# Patient Record
Sex: Female | Born: 1950 | Race: White | Hispanic: No | Marital: Single | Smoking: Never smoker
Health system: Southern US, Community
[De-identification: ages and names within clinical notes are randomized; demographics above are authoritative.]

## PROBLEM LIST (undated history)

## (undated) DIAGNOSIS — D369 Benign neoplasm, unspecified site: Secondary | ICD-10-CM

## (undated) DIAGNOSIS — G4700 Insomnia, unspecified: Secondary | ICD-10-CM

## (undated) DIAGNOSIS — M8580 Other specified disorders of bone density and structure, unspecified site: Secondary | ICD-10-CM

## (undated) DIAGNOSIS — M722 Plantar fascial fibromatosis: Secondary | ICD-10-CM

## (undated) DIAGNOSIS — K5792 Diverticulitis of intestine, part unspecified, without perforation or abscess without bleeding: Secondary | ICD-10-CM

## (undated) DIAGNOSIS — M1990 Unspecified osteoarthritis, unspecified site: Secondary | ICD-10-CM

## (undated) DIAGNOSIS — F32A Depression, unspecified: Secondary | ICD-10-CM

## (undated) DIAGNOSIS — M5030 Other cervical disc degeneration, unspecified cervical region: Secondary | ICD-10-CM

## (undated) DIAGNOSIS — E559 Vitamin D deficiency, unspecified: Secondary | ICD-10-CM

## (undated) HISTORY — DX: Insomnia, unspecified: G47.00

## (undated) HISTORY — PX: DILATION AND CURETTAGE OF UTERUS: SHX78

## (undated) HISTORY — DX: Other cervical disc degeneration, unspecified cervical region: M50.30

## (undated) HISTORY — DX: Depression, unspecified: F32.A

## (undated) HISTORY — DX: Benign neoplasm, unspecified site: D36.9

## (undated) HISTORY — PX: COLONOSCOPY: SHX174

## (undated) HISTORY — DX: Other specified disorders of bone density and structure, unspecified site: M85.80

## (undated) HISTORY — DX: Unspecified osteoarthritis, unspecified site: M19.90

## (undated) HISTORY — DX: Diverticulitis of intestine, part unspecified, without perforation or abscess without bleeding: K57.92

## (undated) HISTORY — DX: Plantar fascial fibromatosis: M72.2

## (undated) HISTORY — DX: Vitamin D deficiency, unspecified: E55.9

---

## 1998-06-17 ENCOUNTER — Other Ambulatory Visit: Admission: RE | Admit: 1998-06-17 | Discharge: 1998-06-17 | Payer: Self-pay | Admitting: Obstetrics and Gynecology

## 1999-07-07 ENCOUNTER — Encounter: Admission: RE | Admit: 1999-07-07 | Discharge: 1999-07-07 | Payer: Self-pay | Admitting: Obstetrics and Gynecology

## 1999-07-07 ENCOUNTER — Encounter: Payer: Self-pay | Admitting: Obstetrics and Gynecology

## 1999-08-25 ENCOUNTER — Other Ambulatory Visit: Admission: RE | Admit: 1999-08-25 | Discharge: 1999-08-25 | Payer: Self-pay | Admitting: Obstetrics and Gynecology

## 2000-10-04 ENCOUNTER — Other Ambulatory Visit: Admission: RE | Admit: 2000-10-04 | Discharge: 2000-10-04 | Payer: Self-pay | Admitting: Obstetrics and Gynecology

## 2001-04-22 ENCOUNTER — Encounter: Admission: RE | Admit: 2001-04-22 | Discharge: 2001-04-22 | Payer: Self-pay | Admitting: Obstetrics and Gynecology

## 2001-04-22 ENCOUNTER — Encounter: Payer: Self-pay | Admitting: Obstetrics and Gynecology

## 2001-10-20 ENCOUNTER — Other Ambulatory Visit: Admission: RE | Admit: 2001-10-20 | Discharge: 2001-10-20 | Payer: Self-pay | Admitting: Obstetrics and Gynecology

## 2002-09-22 ENCOUNTER — Other Ambulatory Visit: Admission: RE | Admit: 2002-09-22 | Discharge: 2002-09-22 | Payer: Self-pay | Admitting: Obstetrics and Gynecology

## 2003-03-17 ENCOUNTER — Encounter: Admission: RE | Admit: 2003-03-17 | Discharge: 2003-03-17 | Payer: Self-pay | Admitting: Obstetrics and Gynecology

## 2003-03-17 ENCOUNTER — Encounter: Payer: Self-pay | Admitting: Obstetrics and Gynecology

## 2003-11-18 ENCOUNTER — Other Ambulatory Visit: Admission: RE | Admit: 2003-11-18 | Discharge: 2003-11-18 | Payer: Self-pay | Admitting: Obstetrics and Gynecology

## 2004-04-12 ENCOUNTER — Encounter: Admission: RE | Admit: 2004-04-12 | Discharge: 2004-04-12 | Payer: Self-pay | Admitting: Obstetrics and Gynecology

## 2005-02-27 ENCOUNTER — Other Ambulatory Visit: Admission: RE | Admit: 2005-02-27 | Discharge: 2005-02-27 | Payer: Self-pay | Admitting: Obstetrics and Gynecology

## 2005-07-10 ENCOUNTER — Encounter: Admission: RE | Admit: 2005-07-10 | Discharge: 2005-07-10 | Payer: Self-pay | Admitting: Obstetrics and Gynecology

## 2006-03-13 ENCOUNTER — Other Ambulatory Visit: Admission: RE | Admit: 2006-03-13 | Discharge: 2006-03-13 | Payer: Self-pay | Admitting: Obstetrics and Gynecology

## 2007-11-21 ENCOUNTER — Ambulatory Visit (HOSPITAL_COMMUNITY): Admission: RE | Admit: 2007-11-21 | Discharge: 2007-11-21 | Payer: Self-pay | Admitting: Obstetrics and Gynecology

## 2007-11-21 ENCOUNTER — Other Ambulatory Visit: Admission: RE | Admit: 2007-11-21 | Discharge: 2007-11-21 | Payer: Self-pay | Admitting: Obstetrics and Gynecology

## 2009-01-11 ENCOUNTER — Encounter: Admission: RE | Admit: 2009-01-11 | Discharge: 2009-01-11 | Payer: Self-pay | Admitting: Obstetrics and Gynecology

## 2009-01-11 ENCOUNTER — Encounter: Payer: Self-pay | Admitting: Obstetrics and Gynecology

## 2009-01-11 ENCOUNTER — Other Ambulatory Visit: Admission: RE | Admit: 2009-01-11 | Discharge: 2009-01-11 | Payer: Self-pay | Admitting: Obstetrics and Gynecology

## 2009-01-11 ENCOUNTER — Ambulatory Visit: Payer: Self-pay | Admitting: Obstetrics and Gynecology

## 2009-03-14 ENCOUNTER — Ambulatory Visit: Payer: Self-pay | Admitting: Obstetrics and Gynecology

## 2009-06-08 ENCOUNTER — Ambulatory Visit: Payer: Self-pay | Admitting: Obstetrics and Gynecology

## 2009-06-27 ENCOUNTER — Ambulatory Visit: Payer: Self-pay | Admitting: Obstetrics and Gynecology

## 2010-02-27 ENCOUNTER — Encounter: Admission: RE | Admit: 2010-02-27 | Discharge: 2010-02-27 | Payer: Self-pay | Admitting: Obstetrics and Gynecology

## 2010-02-27 ENCOUNTER — Ambulatory Visit: Payer: Self-pay | Admitting: Obstetrics and Gynecology

## 2010-02-27 ENCOUNTER — Other Ambulatory Visit: Admission: RE | Admit: 2010-02-27 | Discharge: 2010-02-27 | Payer: Self-pay | Admitting: Obstetrics and Gynecology

## 2010-02-28 ENCOUNTER — Ambulatory Visit: Payer: Self-pay | Admitting: Obstetrics and Gynecology

## 2010-03-02 ENCOUNTER — Encounter: Admission: RE | Admit: 2010-03-02 | Discharge: 2010-03-02 | Payer: Self-pay | Admitting: Obstetrics and Gynecology

## 2010-07-10 ENCOUNTER — Other Ambulatory Visit: Payer: Self-pay | Admitting: Obstetrics and Gynecology

## 2010-07-10 DIAGNOSIS — Z09 Encounter for follow-up examination after completed treatment for conditions other than malignant neoplasm: Secondary | ICD-10-CM

## 2010-08-14 ENCOUNTER — Ambulatory Visit
Admission: RE | Admit: 2010-08-14 | Discharge: 2010-08-14 | Disposition: A | Discharge status: Home / Self Care | Payer: 59 | Source: Ambulatory Visit | Attending: Obstetrics and Gynecology | Admitting: Obstetrics and Gynecology

## 2010-08-14 DIAGNOSIS — Z09 Encounter for follow-up examination after completed treatment for conditions other than malignant neoplasm: Secondary | ICD-10-CM

## 2010-12-15 ENCOUNTER — Ambulatory Visit (INDEPENDENT_AMBULATORY_CARE_PROVIDER_SITE_OTHER): Payer: 59 | Admitting: Obstetrics and Gynecology

## 2010-12-15 DIAGNOSIS — N390 Urinary tract infection, site not specified: Secondary | ICD-10-CM

## 2010-12-15 DIAGNOSIS — R82998 Other abnormal findings in urine: Secondary | ICD-10-CM

## 2010-12-15 DIAGNOSIS — B373 Candidiasis of vulva and vagina: Secondary | ICD-10-CM

## 2010-12-15 DIAGNOSIS — N949 Unspecified condition associated with female genital organs and menstrual cycle: Secondary | ICD-10-CM

## 2011-01-10 ENCOUNTER — Telehealth: Payer: Self-pay

## 2011-01-10 DIAGNOSIS — B373 Candidiasis of vulva and vagina: Secondary | ICD-10-CM

## 2011-01-10 DIAGNOSIS — N390 Urinary tract infection, site not specified: Secondary | ICD-10-CM

## 2011-01-10 MED ORDER — FLUCONAZOLE 200 MG PO TABS: 200.0000 mg | ORAL_TABLET | Freq: Every day | ORAL | Status: AC

## 2011-01-10 MED ORDER — CIPROFLOXACIN HCL 500 MG PO TABS: 500.0000 mg | ORAL_TABLET | Freq: Two times a day (BID) | ORAL | Status: AC

## 2011-01-10 NOTE — Telephone Encounter (Signed)
Thanks for doing this -

## 2011-01-10 NOTE — Telephone Encounter (Signed)
Yes I do. We were supposed to call in medication once I had her pharmacy info. Please provide me the chart please.

## 2011-01-10 NOTE — Telephone Encounter (Signed)
PT CALLED WITH HER PHARMACY INFO. & FAXED YOU INFO. 01/09/11 ABOUT ?UTI. I DO NOT SEE AN ENCOUNTER IN THE SYSTEM GENERATED BY YOU. DO YOU KNOW WHAT PT IS REFERRING TO?

## 2011-05-29 ENCOUNTER — Other Ambulatory Visit: Payer: Self-pay | Admitting: Obstetrics and Gynecology

## 2011-05-29 DIAGNOSIS — N63 Unspecified lump in unspecified breast: Secondary | ICD-10-CM

## 2011-06-11 ENCOUNTER — Encounter: Payer: Self-pay | Admitting: *Deleted

## 2011-06-11 ENCOUNTER — Ambulatory Visit (INDEPENDENT_AMBULATORY_CARE_PROVIDER_SITE_OTHER): Payer: 59 | Admitting: Obstetrics and Gynecology

## 2011-06-11 ENCOUNTER — Encounter: Payer: Self-pay | Admitting: Obstetrics and Gynecology

## 2011-06-11 ENCOUNTER — Ambulatory Visit
Admission: RE | Admit: 2011-06-11 | Discharge: 2011-06-11 | Disposition: A | Discharge status: Home / Self Care | Payer: 59 | Source: Ambulatory Visit | Attending: Obstetrics and Gynecology | Admitting: Obstetrics and Gynecology

## 2011-06-11 ENCOUNTER — Other Ambulatory Visit (HOSPITAL_COMMUNITY)
Admission: RE | Admit: 2011-06-11 | Discharge: 2011-06-11 | Disposition: A | Discharge status: Home / Self Care | Payer: 59 | Source: Ambulatory Visit | Attending: Obstetrics and Gynecology | Admitting: Obstetrics and Gynecology

## 2011-06-11 VITALS — BP 120/76 | Ht 67.0 in | Wt 154.0 lb

## 2011-06-11 DIAGNOSIS — Z833 Family history of diabetes mellitus: Secondary | ICD-10-CM

## 2011-06-11 DIAGNOSIS — Z01419 Encounter for gynecological examination (general) (routine) without abnormal findings: Secondary | ICD-10-CM

## 2011-06-11 DIAGNOSIS — N63 Unspecified lump in unspecified breast: Secondary | ICD-10-CM

## 2011-06-11 DIAGNOSIS — Z13220 Encounter for screening for lipoid disorders: Secondary | ICD-10-CM

## 2011-06-11 LAB — GLUCOSE, RANDOM: Glucose, Bld: 97 mg/dL (ref 70–99)

## 2011-06-11 MED ORDER — ESTRADIOL 0.1 MG/GM VA CREA: 1.0000 g | TOPICAL_CREAM | Freq: Every day | VAGINAL | Status: DC

## 2011-06-11 MED ORDER — NITROFURANTOIN MONOHYD MACRO 100 MG PO CAPS: 100.0000 mg | ORAL_CAPSULE | ORAL | Status: AC

## 2011-06-11 NOTE — Progress Notes (Signed)
Patient came to see me today for her annual GYN exam. She is having her mammogram today as well. She is up-to-date on bone densities. She is stable osteopenia without an elevated fracture risk. She has had no fractures. She uses estrogen cream with excellent results. She notices after intercourse that she will sometimes feel like a UTI is starting. If she takes a Cipro or Macrodantin it'll resolve. She is having no vaginal bleeding. She is having no pelvic pain.  Physical examination:  Molly Ortiz present. HEENT within normal limits. Neck: Thyroid not large. No masses. Supraclavicular nodes: not enlarged. Breasts: Examined in both sitting midline position. No skin changes and no masses. Abdomen: Soft no guarding rebound or masses or hernia. Pelvic: External: Within normal limits. BUS: Within normal limits. Vaginal:within normal limits. Good estrogen effect. No evidence of cystocele rectocele or enterocele. Cervix: clean. Uterus: Normal size and shape. Adnexa: No masses. Rectovaginal exam: Confirmatory and negative. Extremities: Within normal limits.  Assessment: Atrophic vaginitis. UTIs related to intercourse.  Plan: Mammogram. Continue Estrace vaginal cream. Macrodantin 1 postcoitally.

## 2011-06-11 NOTE — Progress Notes (Signed)
Patient ID: Molly Ortiz, female   DOB: 1950/06/24, 61 y.o.   MRN: [id number] Pharmacy called to clarify that Macrobid 100 mg rx # 30 1 by mouth daily was correct. Pt has frequent UTI after intercourse this is why # 30 was prescribed.

## 2011-06-27 ENCOUNTER — Other Ambulatory Visit: Payer: Self-pay | Admitting: *Deleted

## 2011-06-27 DIAGNOSIS — E7800 Pure hypercholesterolemia, unspecified: Secondary | ICD-10-CM

## 2012-03-26 ENCOUNTER — Other Ambulatory Visit: Payer: 59

## 2013-01-28 ENCOUNTER — Encounter: Payer: Self-pay | Admitting: Gynecology

## 2013-06-24 ENCOUNTER — Encounter: Payer: Self-pay | Admitting: Gynecology

## 2013-06-26 ENCOUNTER — Other Ambulatory Visit (HOSPITAL_COMMUNITY)
Admission: RE | Admit: 2013-06-26 | Discharge: 2013-06-26 | Disposition: A | Discharge status: Home / Self Care | Payer: 59 | Source: Ambulatory Visit | Attending: Gynecology | Admitting: Gynecology

## 2013-06-26 ENCOUNTER — Ambulatory Visit (INDEPENDENT_AMBULATORY_CARE_PROVIDER_SITE_OTHER): Payer: Self-pay | Admitting: Gynecology

## 2013-06-26 ENCOUNTER — Encounter: Payer: Self-pay | Admitting: Gynecology

## 2013-06-26 VITALS — BP 124/86 | Ht 67.0 in | Wt 154.0 lb

## 2013-06-26 DIAGNOSIS — N950 Postmenopausal bleeding: Secondary | ICD-10-CM

## 2013-06-26 DIAGNOSIS — Z79890 Hormone replacement therapy (postmenopausal): Secondary | ICD-10-CM

## 2013-06-26 DIAGNOSIS — Z1151 Encounter for screening for human papillomavirus (HPV): Secondary | ICD-10-CM | POA: Insufficient documentation

## 2013-06-26 DIAGNOSIS — Z8049 Family history of malignant neoplasm of other genital organs: Secondary | ICD-10-CM

## 2013-06-26 DIAGNOSIS — N898 Other specified noninflammatory disorders of vagina: Secondary | ICD-10-CM

## 2013-06-26 DIAGNOSIS — Z01419 Encounter for gynecological examination (general) (routine) without abnormal findings: Secondary | ICD-10-CM | POA: Insufficient documentation

## 2013-06-26 DIAGNOSIS — N952 Postmenopausal atrophic vaginitis: Secondary | ICD-10-CM

## 2013-06-26 LAB — WET PREP FOR TRICH, YEAST, CLUE
Clue Cells Wet Prep HPF POC: NONE SEEN
Trich, Wet Prep: NONE SEEN
Yeast Wet Prep HPF POC: NONE SEEN

## 2013-06-26 MED ORDER — ESTRADIOL 0.1 MG/GM VA CREA: 1.0000 g | TOPICAL_CREAM | Freq: Every day | VAGINAL | Status: DC

## 2013-06-26 NOTE — Progress Notes (Signed)
Molly Ortiz 05-Jan-1951 [id number]   History:    63 y.o.  for annual gyn exam who has not been seen in the office since 2012. The patient had stated that her mother had died of metastatic cancer in which it was uncertain whether it was a uterine or cervical. The patient several years ago had postmenopausal bleeding and had a sonohysterogram with benign pathology. Her Pap smears have been reported to be normal. She stated that she had been on hormone replacement therapy consisting a vaginal*3 times per week but she had been off of it for several months. 3 months ago she had spotting for 3 days and recently as well. She has not been sexually active in over 4 months. Her last colonoscopy was reported to be 3 years ago but it was incomplete and did not return for followup. She does have history of colon polyps in the past. Her mammogram was over 3 years. Her bone density study was in 2011. She reached menopause at age 70. 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 Patient states her flu vaccine and shingles vaccine are both up to date.  Past medical history,surgical history, family history and social history were all reviewed and documented in the EPIC chart.  Gynecologic History No LMP recorded. Patient is postmenopausal. Contraception: post menopausal status Last Pap: 2012. Results were: normal Last mammogram: 3 years ago. Results were: Normal but dense  Obstetric History OB History  Gravida Para Term Preterm AB SAB TAB Ectopic Multiple Living  7    7 6 1    0    # Outcome Date GA Lbr Len/2nd Weight Sex Delivery Anes PTL Lv  7 TAB           6 SAB           5 SAB           4 SAB           3 SAB           2 SAB           1 SAB                ROS: A ROS was performed and pertinent positives and negatives are included in the history.  GENERAL: No fevers or  chills. HEENT: No change in vision, no earache, sore throat or sinus congestion. NECK: No pain or stiffness. CARDIOVASCULAR: No chest pain or pressure. No palpitations. PULMONARY: No shortness of breath, cough or wheeze. GASTROINTESTINAL: No abdominal pain, nausea, vomiting or diarrhea, melena or bright red blood per rectum. GENITOURINARY: No urinary frequency, urgency, hesitancy or dysuria. MUSCULOSKELETAL: No joint or muscle pain, no back pain, no recent trauma. DERMATOLOGIC: No rash, no itching, no lesions. ENDOCRINE: No polyuria, polydipsia, no heat or cold intolerance. No recent change in weight. HEMATOLOGICAL: No anemia or easy bruising or bleeding. NEUROLOGIC: No headache, seizures, numbness, tingling or weakness. PSYCHIATRIC: No depression, no loss of interest in normal activity or change in sleep pattern.     Exam: chaperone present  BP 124/86  Ht 5\' 7"  (1.702 m)  Wt 154 lb (69.854 kg)  BMI 24.11 kg/m2  Body mass index is 24.11 kg/(m^2).  General appearance : Well developed well nourished female. No acute distress HEENT: Neck supple, trachea midline, no carotid bruits, no thyroidmegaly Lungs: Clear to auscultation,  no rhonchi or wheezes, or rib retractions  Heart: Regular rate and rhythm, no murmurs or gallops Breast:Examined in sitting and supine position were symmetrical in appearance, no palpable masses or tenderness,  no skin retraction, no nipple inversion, no nipple discharge, no skin discoloration, no axillary or supraclavicular lymphadenopathy Abdomen: no palpable masses or tenderness, no rebound or guarding Extremities: no edema or skin discoloration or tenderness  Pelvic:  Bartholin, Urethra, Skene Glands: Within normal limits             Vagina: No gross lesions or discharge, atrophic changes yellowish-like discharge  Cervix: No gross lesions or discharge  Uterus  anteverted small, normal size, shape and consistency, non-tender and mobile  Adnexa  Without masses or  tenderness  Anus and perineum  normal   Rectovaginal  normal sphincter tone without palpated masses or tenderness             Hemoccult cards provided   Wet prep bacteria otherwise negative  Assessment/Plan:  63 y.o. female for annual exam with postmenopausal bleeding had not been on any vaginal estrogen cream for approximately 8 months and began spotting 3 months ago. Patient will be scheduled to return for sonohysterogram and endometrial biopsy. Mother's origin of cancer still uncertain whether it originated and cervix or uterus. Pap smear was done today. We will continue to do Pap smears yearly regardless of the guidelines and do family history described. She was given requisitions to schedule her mammogram. She was given names of gastroenterologist in her community to schedule colonoscopy. He will need a bone density study later this year as well. Literature information on Tdap was provided. Her primary care physician in [place] did her blood work 2 months ago and she stated that it was normal.  Note: This dictation was prepared with  Dragon/digital dictation along Games developer. Any transcriptional errors that result from this process are unintentional.   Terrance Mass MD, 10:39 AM 06/26/2013

## 2013-06-26 NOTE — Patient Instructions (Signed)
Tetanus, Diphtheria (Td) Vaccine What You Need to Know WHY GET VACCINATED? Tetanus  and diphtheria are very serious diseases. They are rare in the [place] today, but people who do become infected often have severe complications. Td vaccine is used to protect adolescents and adults from both of these diseases. Both tetanus and diphtheria are infections caused by bacteria. Diphtheria spreads from person to person through coughing or sneezing. Tetanus-causing bacteria enter the body through cuts, scratches, or wounds. TETANUS (Lockjaw) causes painful muscle tightening and stiffness, usually all over the body.  It can lead to tightening of muscles in the head and neck so you can't open your mouth, swallow, or sometimes even breathe. Tetanus kills about 1 out of every 5 people who are infected. DIPHTHERIA can cause a thick coating to form in the back of the throat.  It can lead to breathing problems, paralysis, heart failure, and death. Before vaccines, the [place] saw as many as 200,000 cases a year of diphtheria and hundreds of cases of tetanus. Since vaccination began, cases of both diseases have dropped by about 99%. TD VACCINE Td vaccine can protect adolescents and adults from tetanus and diphtheria. Td is usually given as a booster dose every 10 years but it can also be given earlier after a severe and dirty wound or burn. Your doctor can give you more information. Td may safely be given at the same time as other vaccines. SOME PEOPLE SHOULD NOT GET THIS VACCINE  If you ever had a life-threatening allergic reaction after a dose of any tetanus or diphtheria containing vaccine, OR if you have a severe allergy to any part of this vaccine, you should not get Td. Tell your doctor if you have any severe allergies.  Talk to your doctor if you:  have epilepsy or another nervous system problem,  had severe pain or swelling after any vaccine containing diphtheria or tetanus,  ever had  Guillain Barr Syndrome (GBS),  aren't feeling well on the day the shot is scheduled. RISKS OF A VACCINE REACTION With a vaccine, like any medicine, there is a chance of side effects. These are usually mild and go away on their own. Serious side effects are also possible, but are very rare. Most people who get Td vaccine do not have any problems with it. Mild Problems  following Td (Did not interfere with activities)  Pain where the shot was given (about 8 people in 10)  Redness or swelling where the shot was given (about 1 person in 3)  Mild fever (about 1 person in 15)  Headache or Tiredness (uncommon) Moderate Problems following Td (Interfered with activities, but did not require medical attention)  Fever over 102 F (38.9 C) (rare) Severe Problems  following Td (Unable to perform usual activities; required medical attention)  Swelling, severe pain, bleeding, or redness in the arm where the shot was given (rare). Problems that could happen after any vaccine:  Brief fainting spells can happen after any medical procedure, including vaccination. Sitting or lying down for about 15 minutes can help prevent fainting, and injuries caused by a fall. Tell your doctor if you feel dizzy, or have vision changes or ringing in the ears.  Severe shoulder pain and reduced range of motion in the arm where a shot was given can happen, very rarely, after a vaccination.  Severe allergic reactions from a vaccine are very rare, estimated at less than 1 in a million doses. If one were to occur, it would   usually be within a few minutes to a few hours after the vaccination. WHAT IF THERE IS A SERIOUS REACTION? What should I look for?  Look for anything that concerns you, such as signs of a severe allergic reaction, very high fever, or behavior changes. Signs of a severe allergic reaction can include hives, swelling of the face and throat, difficulty breathing, a fast heartbeat, dizziness, and  weakness. These would usually start a few minutes to a few hours after the vaccination. What should I do?  If you think it is a severe allergic reaction or other emergency that can't wait, call 911 or get the person to the nearest hospital. Otherwise, call your doctor.  Afterward, the reaction should be reported to the Vaccine Adverse Event Reporting System (VAERS). Your doctor might file this report, or, you can do it yourself through the VAERS website or by calling [phone]. VAERS is only for reporting reactions. They do not give medical advice. THE NATIONAL VACCINE INJURY COMPENSATION PROGRAM The National Vaccine Injury Compensation Program (VICP) is a federal program that was created to compensate people who may have been injured by certain vaccines. Persons who believe they may have been injured by a vaccine can learn about the program and about filing a claim by calling 508-271-7269 or visiting the [facility] website. HOW CAN I LEARN MORE?  Ask your doctor.  Contact your local or state health department.  Contact the Centers for Disease Control and Prevention (CDC):  Call 734-146-0372 (1-800-CDC-INFO)  Visit CDC's vaccines website CDC Td Vaccine Interim VIS (07/15/12) Document Released: 03/25/2006 Document Revised: 09/22/2012 Document Reviewed: 09/17/2012 [facility] Patient Information 2014 [place], [place]. Transvaginal Ultrasound Transvaginal ultrasound is a pelvic ultrasound, using a metal probe that is placed in the vagina, to look at a women's female organs. Transvaginal ultrasound is a method of seeing inside the pelvis of a woman. The ultrasound machine sends out sound waves from the transducer (probe). These sound waves bounce off body structures (like an echo) to create a picture. The picture shows up on a monitor. It is called transvaginal because the probe is inserted into the vagina. There should be very little discomfort from the vaginal probe. This test can also be used  during pregnancy. Endovaginal ultrasound is another name for a transvaginal ultrasound. In a transabdominal ultrasound, the probe is placed on the outside of the belly. This method gives pictures that are lower quality than pictures from the transvaginal technique. Transvaginal ultrasound is used to look for problems of the female genital tract. Some such problems include:  Infertility problems.  Congenital (birth defect) malformations of the uterus and ovaries.  Tumors in the uterus.  Abnormal bleeding.  Ovarian tumors and cysts.  Abscess (inflamed tissue around pus) in the pelvis.  Unexplained abdominal or pelvic pain.  Pelvic infection. DURING PREGNANCY, TRANSVAGINAL ULTRASOUND MAY BE USED TO LOOK AT:  Normal pregnancy.  Ectopic pregnancy (pregnancy outside the uterus).  Fetal heartbeat.  Abnormalities in the pelvis, that are not seen well with transabdominal ultrasound.  Suspected twins or multiples.  Impending miscarriage.  Problems with the cervix (incompetent cervix, not able to stay closed and hold the baby).  When doing an amniocentesis (removing fluid from the pregnancy sac, for testing).  Looking for abnormalities of the baby.  Checking the growth, development, and age of the fetus.  Measuring the amount of fluid in the amniotic sac.  When doing an external version of the baby (moving baby into correct position).  Evaluating the baby for  problems in high risk pregnancies (biophysical profile).  Suspected fetal demise (death). Sometimes a special ultrasound method called Saline Infusion Sonography (SIS) is used for a more accurate look at the uterus. Sterile saline (salt water) is injected into the uterus of non-pregnant patients to see the inside of the uterus better. SIS is not used on pregnant women. The vaginal probe can also assist in obtaining biopsies of abnormal areas, in draining fluid from cysts on the ovary, and in finding IUDs (intrauterine  device, birth control) that cannot be located. PREPARATION FOR TEST A transvaginal ultrasound is done with the bladder empty. The transabdominal ultrasound is done with your bladder full. You may be asked to drink several glasses of water before that exam. Sometimes, a transabdominal ultrasound is done just after a transvaginal ultrasound, to look at organs in your abdomen. PROCEDURE  You will lie down on a table, with your knees bent and your feet in foot holders. The probe is covered with a condom. A sterile lubricant is put into the vagina and on the probe. The lubricant helps transmit the sound waves and avoid irritating the vagina. Your caregiver will move the probe inside the vaginal cavity to scan the pelvic structures. A normal test will show a normal pelvis and normal contents. An abnormal test will show abnormalities of the pelvis, placenta, or baby. ABNORMAL RESULTS MAY BE DUE TO:  Growths or tumors in the:  Uterus.  Ovaries.  Vagina.  Other pelvic structures.  Non-cancerous growths of the uterus and ovaries.  Twisting of the ovary, cutting off blood supply to the ovary (ovarian torsion).  Areas of infection, including:  Pelvic inflammatory disease.  Abscess in the pelvis.  Locating an IUD. PROBLEMS FOUND IN PREGNANT WOMEN MAY INCLUDE:  Ectopic pregnancy (pregnancy outside the uterus).  Multiple pregnancies.  Early dilation (opening) of the cervix. This may indicate an incompetent cervix and early delivery.  Impending miscarriage.  Fetal death.  Problems with the placenta, including:  Placenta has grown over the opening of the womb (placenta previa).  Placenta has separated early in the womb (placental abruption).  Placenta grows into the muscle of the uterus (placenta accreta).  Tumors of pregnancy, including gestational trophoblastic disease. This is an abnormal pregnancy, with no fetus. The uterus is filled with many grape-like cysts that could sometimes  be cancerous.  Incorrect position of the fetus (breech, vertex).  Intrauterine fetal growth retardation (IUGR) (poor growth in the womb).  Fetal abnormalities or infection. RISKS AND COMPLICATIONS There are no known risks to the ultrasound procedure. There is no X-ray used when doing an ultrasound. Document Released: 05/09/2004 Document Revised: 08/20/2011 Document Reviewed: 04/27/2009 [facility] Patient Information 2014 [place], [place].

## 2013-07-17 ENCOUNTER — Encounter: Payer: Self-pay | Admitting: Obstetrics and Gynecology

## 2013-07-30 ENCOUNTER — Ambulatory Visit: Payer: Self-pay | Admitting: Gynecology

## 2014-03-12 ENCOUNTER — Telehealth: Payer: Self-pay | Admitting: *Deleted

## 2014-03-12 MED ORDER — ESTRADIOL 0.1 MG/GM VA CREA: TOPICAL_CREAM | VAGINAL | Status: DC

## 2014-03-12 NOTE — Telephone Encounter (Signed)
Pt called requesting HRT sent to new pharmacy, this was done name of breast imaging center given with number as well

## 2014-04-12 ENCOUNTER — Encounter: Payer: Self-pay | Admitting: Gynecology

## 2014-05-26 DIAGNOSIS — M48062 Spinal stenosis, lumbar region with neurogenic claudication: Secondary | ICD-10-CM | POA: Insufficient documentation

## 2014-08-24 DIAGNOSIS — M7061 Trochanteric bursitis, right hip: Secondary | ICD-10-CM | POA: Insufficient documentation

## 2014-09-13 DIAGNOSIS — M76891 Other specified enthesopathies of right lower limb, excluding foot: Secondary | ICD-10-CM | POA: Insufficient documentation

## 2014-11-18 ENCOUNTER — Other Ambulatory Visit: Payer: Self-pay | Admitting: Internal Medicine

## 2014-11-18 ENCOUNTER — Other Ambulatory Visit: Payer: Self-pay

## 2014-11-18 ENCOUNTER — Telehealth: Payer: Self-pay | Admitting: *Deleted

## 2014-11-18 DIAGNOSIS — Z1231 Encounter for screening mammogram for malignant neoplasm of breast: Secondary | ICD-10-CM

## 2014-11-18 NOTE — Telephone Encounter (Signed)
Pt called c/o vaginal bleeding asked if is related to HRT, pt was having vaginal bleeding per note on 06/2013 never followed up with [facility]. I called pt back and left message to make OV with JF, has annual scheduled on 12/24/14

## 2014-11-19 ENCOUNTER — Ambulatory Visit (INDEPENDENT_AMBULATORY_CARE_PROVIDER_SITE_OTHER): Payer: Medicare Other | Admitting: Gynecology

## 2014-11-19 ENCOUNTER — Ambulatory Visit
Admission: RE | Admit: 2014-11-19 | Discharge: 2014-11-19 | Disposition: A | Discharge status: Home / Self Care | Payer: Medicare Other | Source: Ambulatory Visit

## 2014-11-19 ENCOUNTER — Encounter (INDEPENDENT_AMBULATORY_CARE_PROVIDER_SITE_OTHER): Payer: Self-pay

## 2014-11-19 ENCOUNTER — Encounter: Payer: Self-pay | Admitting: Gynecology

## 2014-11-19 ENCOUNTER — Other Ambulatory Visit (HOSPITAL_COMMUNITY)
Admission: RE | Admit: 2014-11-19 | Discharge: 2014-11-19 | Disposition: A | Discharge status: Home / Self Care | Payer: Medicare Other | Source: Ambulatory Visit | Attending: Gynecology | Admitting: Gynecology

## 2014-11-19 VITALS — BP 140/80 | Ht 67.0 in | Wt 155.0 lb

## 2014-11-19 DIAGNOSIS — Z1231 Encounter for screening mammogram for malignant neoplasm of breast: Secondary | ICD-10-CM

## 2014-11-19 DIAGNOSIS — Z01419 Encounter for gynecological examination (general) (routine) without abnormal findings: Secondary | ICD-10-CM | POA: Diagnosis present

## 2014-11-19 DIAGNOSIS — Z8049 Family history of malignant neoplasm of other genital organs: Secondary | ICD-10-CM | POA: Diagnosis not present

## 2014-11-19 DIAGNOSIS — N950 Postmenopausal bleeding: Secondary | ICD-10-CM | POA: Diagnosis not present

## 2014-11-19 NOTE — Progress Notes (Signed)
   Patient's a 64 year old gravida 7 para 0 AB 7 (6 (AB's one elective termination) who presented to the office a result of postmenopausal bleeding. Patient has been using vaginal estrogen  3 times a week but had stopped for proximally 4 months and restarted again which she took 3 times a week for one week and since she ran out and was due for her annual exam did not get her prescription filled. She bled for 3 times in one week currently not bleeding. Patient's mother had either uterine or cervical cancer does not recall specifically. Patient reached the menopause at age 28. Patient with no past history of any abnormal Pap smears. Pap smear in 2012 2015 were normal  Exam: Blood pressure 140/80 Due to the above complaints a Pap smear was repeated today and she was counseled for an endometrial biopsy. After the Pap smear was obtained the cervix was cleansed with Betadine solution. A single-tooth neck was placed on the anterior cervical lip. A Pipelle was introduced into the uterine cavity. The uterus sounded to proximally 6 cm. No tissue was obtained to submit for evaluation.  Assessment/plan: Postmenopausal bleeding on interrupted vaginal S to include. Endometrial biopsy not able to obtain any tissue for histological evaluation. Possible single isolated event of bleeding may been an atrophic bleed nevertheless patient will return back to the office in a few weeks for sonohysterogram to better assess her intrauterine cavity. The rest of her bimanual examination did not demonstrate any evidence of an enlarged uterus or any adnexal masses.

## 2014-11-19 NOTE — Addendum Note (Signed)
Addended by: Burnett Kanaris on: 11/19/2014 02:07 PM   Modules accepted: Orders

## 2014-11-24 LAB — CYTOLOGY - PAP

## 2014-12-14 ENCOUNTER — Other Ambulatory Visit: Payer: Self-pay | Admitting: Gynecology

## 2014-12-14 DIAGNOSIS — N950 Postmenopausal bleeding: Secondary | ICD-10-CM

## 2014-12-20 ENCOUNTER — Ambulatory Visit (INDEPENDENT_AMBULATORY_CARE_PROVIDER_SITE_OTHER): Payer: Medicare Other

## 2014-12-20 ENCOUNTER — Encounter: Payer: Self-pay | Admitting: Gynecology

## 2014-12-20 ENCOUNTER — Ambulatory Visit (INDEPENDENT_AMBULATORY_CARE_PROVIDER_SITE_OTHER): Payer: Medicare Other | Admitting: Gynecology

## 2014-12-20 DIAGNOSIS — Z8049 Family history of malignant neoplasm of other genital organs: Secondary | ICD-10-CM | POA: Diagnosis not present

## 2014-12-20 DIAGNOSIS — N950 Postmenopausal bleeding: Secondary | ICD-10-CM | POA: Diagnosis not present

## 2014-12-20 DIAGNOSIS — Z8639 Personal history of other endocrine, nutritional and metabolic disease: Secondary | ICD-10-CM

## 2014-12-20 DIAGNOSIS — M8580 Other specified disorders of bone density and structure, unspecified site: Secondary | ICD-10-CM

## 2014-12-20 MED ORDER — ESTRADIOL 10 MCG VA TABS: 1.0000 | ORAL_TABLET | VAGINAL | Status: DC

## 2014-12-20 NOTE — Progress Notes (Signed)
   Patient's a 64 year old gravida 7 para 0 AB 7 (6 (AB's one elective termination) who was seen in the office on 11/19/2014 complaining of postmenopausal bleeding.Patient has been using vaginal estrogen 3 times a week but had stopped for proximally 4 months and restarted again which she took 3 times a week for one week and since she ran out and was due for her annual exam did not get her prescription filled. She bled for 3 times in one week currently not bleeding. Patient's mother had either uterine or cervical cancer does not recall specifically. Patient reached the menopause at age 35. Patient with no past history of any abnormal Pap smears. Pap smear in 2012 2015 in 2016 normal.  An attempted endometrial biopsy on last office visit demonstrated very little tissue obtained. Pap smear was done which was normal. She is here for follow-up sonohysterogram to rule out any intracavitary defect. Ultrasound/sono hysterogram demonstrated the following:  Uterus measured 4.2 x 3.1 x 2.0 cm with endometrial stripe of 1.5 mm. Right and left ovary were atrophic. The cervix was cleansed with Betadine solution. A sterile catheter was introduced into the uterine cavity and normal saline was instilled and no intracavitary defect was noted.  Upon further discussion with the patient patient and indicated in the past she's had history vitamin D deficiency and had to be supplemented but never return for follow-up vitamin D level. Her mammogram and colonoscopy are up-to-date.  Assessment/plan: Postmenopausal bleed probably atrophic. Thin endometrial stripe. We discussed prescribing Vagifem 10 g to apply intravaginally twice a week for vaginal atrophy. Patient overdue for bone density study for which we will schedule. And because of her history vitamin D deficiency will check her vitamin D level today.

## 2014-12-20 NOTE — Patient Instructions (Signed)
Bone Densitometry Bone densitometry is a special X-ray that measures your bone density and can be used to help predict your risk of bone fractures. This test is used to determine bone mineral content and density to diagnose osteoporosis. Osteoporosis is the loss of bone that may cause the bone to become weak. Osteoporosis commonly occurs in women entering menopause. However, it may be found in men and in people with other diseases. PREPARATION FOR TEST No preparation necessary. WHO SHOULD BE TESTED?  All women older than 27.  Postmenopausal women (50 to 53) with risk factors for osteoporosis.  People with a previous fracture caused by normal activities.  People with a small body frame (less than 127 poundsor a body mass index [BMI] of less than 21).  People who have a parent with a hip fracture or history of osteoporosis.  People who smoke.  People who have rheumatoid arthritis.  Anyone who engages in excessive alcohol use (more than 3 drinks most days).  Women who experience early menopause. WHEN SHOULD YOU BE RETESTED? Current guidelines suggest that you should wait at least 2 years before doing a bone density test again if your first test was normal.Recent studies indicated that women with normal bone density may be able to wait a few years before needing to repeat a bone density test. You should discuss this with your caregiver.  NORMAL FINDINGS   Normal: less than standard deviation below normal (greater than -1).  Osteopenia: 1 to 2.5 standard deviations below normal (-1 to -2.5).  Osteoporosis: greater than 2.5 standard deviations below normal (less than -2.5). Test results are reported as a "T score" and a "Z score."The T score is a number that compares your bone density with the bone density of healthy, young women.The Z score is a number that compares your bone density with the scores of women who are the same age, gender, and race.  Ranges for normal findings may vary  among different laboratories and hospitals. You should always check with your doctor after having lab work or other tests done to discuss the meaning of your test results and whether your values are considered within normal limits. MEANING OF TEST  Your caregiver will go over the test results with you and discuss the importance and meaning of your results, as well as treatment options and the need for additional tests if necessary. OBTAINING THE TEST RESULTS It is your responsibility to obtain your test results. Ask the lab or department performing the test when and how you will get your results. Document Released: 06/19/2004 Document Revised: 08/20/2011 Document Reviewed: 07/12/2010 [facility] Patient Information 2015 [place], [place]. This information is not intended to replace advice given to you by your health care provider. Make sure you discuss any questions you have with your health care provider. Estradiol vaginal tablets What is this medicine? ESTRADIOL (es tra DYE ole) vaginal tablet is used to help relieve symptoms of vaginal irritation and dryness that occurs in some women during menopause. This medicine may be used for other purposes; ask your health care provider or pharmacist if you have questions. COMMON BRAND NAME(S): Vagifem What should I tell my health care provider before I take this medicine? They need to know if you have any of these conditions: -abnormal vaginal bleeding -blood vessel disease or blood clots -breast, cervical, endometrial, ovarian, liver, or uterine cancer -dementia -diabetes -gallbladder disease -heart disease or recent heart attack -high blood pressure -high cholesterol -high level of calcium in the blood -hysterectomy -kidney  disease -liver disease -migraine headaches -protein C deficiency -protein S deficiency -stroke -systemic lupus erythematosus (SLE) -tobacco smoker -an unusual or allergic reaction to estrogens, other hormones, medicines,  foods, dyes, or preservatives -pregnant or trying to get pregnant -breast-feeding How should I use this medicine? This medicine is only for use in the vagina. Do not take by mouth. Wash your hands before and after use. Read package directions carefully. Unwrap the pre-filled applicator package. Lie on your back, part and bend your knees. Gently insert the applicator tip high in the vagina and push the plunger to release the tablet into the vagina. Gently remove the applicator. Throw away the applicator after use. Do not use your medicine more often than directed. Finish the full course prescribed by your doctor or health care professional even if you think your condition is better. Do not stop using except on the advice of your doctor or health care professional. Talk to your pediatrician regarding the use of this medicine in children. A patient package insert for the product will be given with each prescription and refill. Read this sheet carefully each time. The sheet may change frequently. Overdosage: If you think you have taken too much of this medicine contact a poison control center or emergency room at once. NOTE: This medicine is only for you. Do not share this medicine with others. What if I miss a dose? If you miss a dose, take it as soon as you can. If it is almost time for your next dose, take only that dose. Do not take double or extra doses. What may interact with this medicine? Do not take this medicine with any of the following medications: -aromatase inhibitors like aminoglutethimide, anastrozole, exemestane, letrozole, testolactone This medicine may also interact with the following medications: -antibiotics used to treat tuberculosis like rifabutin, rifampin and rifapentene -raloxifene or tamoxifen -warfarin This list may not describe all possible interactions. Give your health care provider a list of all the medicines, herbs, non-prescription drugs, or dietary supplements you  use. Also tell them if you smoke, drink alcohol, or use illegal drugs. Some items may interact with your medicine. What should I watch for while using this medicine? Visit your health care professional for regular checks on your progress. You will need a regular breast and pelvic exam. You should also discuss the need for regular mammograms with your health care professional, and follow his or her guidelines. This medicine can make your body retain fluid, making your fingers, hands, or ankles swell. Your blood pressure can go up. Contact your doctor or health care professional if you feel you are retaining fluid. If you have any reason to think you are pregnant; stop taking this medicine at once and contact your doctor or health care professional. Tobacco smoking increases the risk of getting a blood clot or having a stroke, especially if you are more than 66 years old. You are strongly advised not to smoke. If you wear contact lenses and notice visual changes, or if the lenses begin to feel uncomfortable, consult your eye care specialist. If you are going to have elective surgery, you may need to stop taking this medicine beforehand. Consult your health care professional for advice prior to scheduling the surgery. What side effects may I notice from receiving this medicine? Side effects that you should report to your doctor or health care professional as soon as possible: -allergic reactions like skin rash, itching or hives, swelling of the face, lips, or tongue -breast tissue changes  or discharge -changes in vision -chest pain -confusion, trouble speaking or understanding -dark urine -general ill feeling or flu-like symptoms -light-colored stools -nausea, vomiting -pain, swelling, warmth in the leg -right upper belly pain -severe headaches -shortness of breath -sudden numbness or weakness of the face, arm or leg -trouble walking, dizziness, loss of balance or coordination -unusual vaginal  bleeding -yellowing of the eyes or skin Side effects that usually do not require medical attention (report to your doctor or health care professional if they continue or are bothersome): -hair loss -increased hunger or thirst -increased urination -symptoms of vaginal infection like itching, irritation or unusual discharge -unusually weak or tired This list may not describe all possible side effects. Call your doctor for medical advice about side effects. You may report side effects to FDA at 1-800-FDA-1088. Where should I keep my medicine? Keep out of the reach of children. Store at room temperature between 15 and 30 degrees C (59 and 86 degrees F). Throw away any unused medicine after the expiration date. NOTE: This sheet is a summary. It may not cover all possible information. If you have questions about this medicine, talk to your doctor, pharmacist, or health care provider.  2015, Elsevier/[employer] Standard. (2010-08-30 09:08:58)

## 2014-12-21 ENCOUNTER — Other Ambulatory Visit: Payer: Self-pay | Admitting: Gynecology

## 2014-12-21 ENCOUNTER — Encounter: Payer: Self-pay | Admitting: Gynecology

## 2014-12-21 DIAGNOSIS — E559 Vitamin D deficiency, unspecified: Secondary | ICD-10-CM

## 2014-12-21 LAB — VITAMIN D 25 HYDROXY (VIT D DEFICIENCY, FRACTURES): Vit D, 25-Hydroxy: 25 ng/mL — ABNORMAL LOW (ref 30–100)

## 2014-12-21 MED ORDER — VITAMIN D (ERGOCALCIFEROL) 1.25 MG (50000 UNIT) PO CAPS: 50000.0000 [IU] | ORAL_CAPSULE | ORAL | Status: DC

## 2014-12-24 ENCOUNTER — Encounter: Payer: Self-pay | Admitting: Gynecology

## 2014-12-27 ENCOUNTER — Encounter: Payer: Medicare Other | Admitting: Gynecology

## 2015-02-10 HISTORY — PX: LUMBAR FUSION: SHX111

## 2015-04-18 ENCOUNTER — Telehealth: Payer: Self-pay | Admitting: *Deleted

## 2015-04-18 NOTE — Telephone Encounter (Signed)
Make an appointment with Marya Amsler

## 2015-04-18 NOTE — Telephone Encounter (Signed)
Pt called asking for a name of a doctor that could help manage Cymbalta, pt is not currently taking now,but would like to start back on Rx. Please advise

## 2015-04-18 NOTE — Telephone Encounter (Signed)
Patient aware number [phone] and [phone] pt will call to set up appointment.

## 2015-04-25 ENCOUNTER — Ambulatory Visit (INDEPENDENT_AMBULATORY_CARE_PROVIDER_SITE_OTHER): Payer: Medicare Other | Admitting: Licensed Clinical Social Worker

## 2015-04-25 DIAGNOSIS — F331 Major depressive disorder, recurrent, moderate: Secondary | ICD-10-CM | POA: Diagnosis not present

## 2015-05-04 ENCOUNTER — Ambulatory Visit (HOSPITAL_COMMUNITY): Payer: Medicare Other | Admitting: Psychiatry

## 2015-06-17 ENCOUNTER — Telehealth: Payer: Self-pay | Admitting: *Deleted

## 2015-06-17 NOTE — Telephone Encounter (Signed)
Pt called c/o UTI requesting Rx, pt lives in [place], I asked pt to make OV with PCP so correct medication can be prescribed. Pt said she has a NP that she could see. Pt will call her office to schedule.

## 2016-01-31 ENCOUNTER — Ambulatory Visit: Payer: Medicare Other | Admitting: Gynecology

## 2016-02-20 ENCOUNTER — Encounter: Payer: Self-pay | Admitting: Gynecology

## 2016-02-20 ENCOUNTER — Ambulatory Visit (INDEPENDENT_AMBULATORY_CARE_PROVIDER_SITE_OTHER): Payer: Medicare Other | Admitting: Gynecology

## 2016-02-20 VITALS — BP 126/78 | Ht 67.0 in | Wt 140.0 lb

## 2016-02-20 DIAGNOSIS — Z780 Asymptomatic menopausal state: Secondary | ICD-10-CM

## 2016-02-20 DIAGNOSIS — N952 Postmenopausal atrophic vaginitis: Secondary | ICD-10-CM

## 2016-02-20 DIAGNOSIS — Z01419 Encounter for gynecological examination (general) (routine) without abnormal findings: Secondary | ICD-10-CM | POA: Diagnosis not present

## 2016-02-20 DIAGNOSIS — Z8639 Personal history of other endocrine, nutritional and metabolic disease: Secondary | ICD-10-CM

## 2016-02-20 DIAGNOSIS — Z124 Encounter for screening for malignant neoplasm of cervix: Secondary | ICD-10-CM | POA: Diagnosis not present

## 2016-02-20 DIAGNOSIS — Z8049 Family history of malignant neoplasm of other genital organs: Secondary | ICD-10-CM

## 2016-02-20 NOTE — Addendum Note (Signed)
Addended by: Thurnell Garbe A on: 02/20/2016 12:43 PM   Modules accepted: Orders

## 2016-02-20 NOTE — Patient Instructions (Signed)
Colonoscopy A colonoscopy is an exam to look at the entire large intestine (colon). This exam can help find problems such as tumors, polyps, inflammation, and areas of bleeding. The exam takes about 1 hour.  LET [facility] CARE PROVIDER KNOW ABOUT:   Any allergies you have.  All medicines you are taking, including vitamins, herbs, eye drops, creams, and over-the-counter medicines.  Previous problems you or members of your family have had with the use of anesthetics.  Any blood disorders you have.  Previous surgeries you have had.  Medical conditions you have. RISKS AND COMPLICATIONS  Generally, this is a safe procedure. However, as with any procedure, complications can occur. Possible complications include:  Bleeding.  Tearing or rupture of the colon wall.  Reaction to medicines given during the exam.  Infection (rare). BEFORE THE PROCEDURE   Ask your health care provider about changing or stopping your regular medicines.  You may be prescribed an oral bowel prep. This involves drinking a large amount of medicated liquid, starting the day before your procedure. The liquid will cause you to have multiple loose stools until your stool is almost clear or light green. This cleans out your colon in preparation for the procedure.  Do not eat or drink anything else once you have started the bowel prep, unless your health care provider tells you it is safe to do so.  Arrange for someone to drive you home after the procedure. PROCEDURE   You will be given medicine to help you relax (sedative).  You will lie on your side with your knees bent.  A long, flexible tube with a light and camera on the end (colonoscope) will be inserted through the rectum and into the colon. The camera sends video back to a computer screen as it moves through the colon. The colonoscope also releases carbon dioxide gas to inflate the colon. This helps your health care provider see the area better.  During  the exam, your health care provider may take a small tissue sample (biopsy) to be examined under a microscope if any abnormalities are found.  The exam is finished when the entire colon has been viewed. AFTER THE PROCEDURE   Do not drive for 24 hours after the exam.  You may have a small amount of blood in your stool.  You may pass moderate amounts of gas and have mild abdominal cramping or bloating. This is caused by the gas used to inflate your colon during the exam.  Ask when your test results will be ready and how you will get your results. Make sure you get your test results.   This information is not intended to replace advice given to you by your health care provider. Make sure you discuss any questions you have with your health care provider.   Document Released: 05/25/2000 Document Revised: 03/18/2013 Document Reviewed: 02/02/2013 Elsevier Interactive Patient Education 2016 [place]. Bone Densitometry Bone densitometry is an imaging test that uses a special X-ray to measure the amount of calcium and other minerals in your bones (bone density). This test is also known as a bone mineral density test or dual-energy X-ray absorptiometry (DXA). The test can measure bone density at your hip and your spine. It is similar to having a regular X-ray. You may have this test to:  Diagnose a condition that causes weak or thin bones (osteoporosis).  Predict your risk of a broken bone (fracture).  Determine how well osteoporosis treatment is working. [place]  KNOW ABOUT:  Any allergies you have.  All medicines you are taking, including vitamins, herbs, eye drops, creams, and over-the-counter medicines.  Previous problems you or members of your family have had with the use of anesthetics.  Any blood disorders you have.  Previous surgeries you have had.  Medical conditions you have.  Possibility of pregnancy.  Any other medical test you had within the  previous 14 days that used contrast material. RISKS AND COMPLICATIONS Generally, this is a safe procedure. However, problems can occur and may include the following:  This test exposes you to a very small amount of radiation.  The risks of radiation exposure may be greater to unborn children. BEFORE THE PROCEDURE  Do not take any calcium supplements for 24 hours before having the test. You can otherwise eat and drink what you usually do.  Take off all metal jewelry, eyeglasses, dental appliances, and any other metal objects. PROCEDURE  You may lie on an exam table. There will be an X-ray generator below you and an imaging device above you.  Other devices, such as boxes or braces, may be used to position your body properly for the scan.  You will need to lie still while the machine slowly scans your body.  The images will show up on a computer monitor. AFTER THE PROCEDURE You may need more testing at a later time.   This information is not intended to replace advice given to you by your health care provider. Make sure you discuss any questions you have with your health care provider.   Document Released: 06/19/2004 Document Revised: 06/18/2014 Document Reviewed: 11/05/2013 Elsevier Interactive Patient Education [employer].

## 2016-02-20 NOTE — Progress Notes (Addendum)
Molly Ortiz February 21, 1951 [id number]   History:    65 y.o.  for annual gyn exam with only complaining sometimes of vaginal atrophy and dryness during intercourse. Patient last year was evaluated for postmenopausal bleeding is no longer on hormone replacement therapy  no vaginal bleeding and minimal vasomotor symptoms. Patient does have history vitamin D deficiency in the past. Her PCP is in [place] will be doing her blood work. Review of her record indicated her last bone density study was in 2011. 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 Patient states her flu vaccine and shingles vaccine are both up to date.The patient had stated that her mother had died of metastatic cancer in which it was uncertain whether it was a uterine or cervical.   Past medical history,surgical history, family history and social history were all reviewed and documented in the EPIC chart.  Gynecologic History No LMP recorded. Patient is postmenopausal. Contraception: post menopausal status Last Pap: 2016. Results were: normal Last mammogram: 2016. Results were: Normal but dense had three-dimensional mammogram  Obstetric History OB History  Gravida Para Term Preterm AB Living  7       7 0  SAB TAB Ectopic Multiple Live Births  6 1          # Outcome Date GA Lbr Len/2nd Weight Sex Delivery Anes PTL Lv  7 TAB           6 SAB           5 SAB           4 SAB           3 SAB           2 SAB           1 SAB                ROS: A ROS was performed and pertinent positives and negatives are included in the history.  GENERAL: No fevers or chills. HEENT: No change in vision, no earache, sore throat or sinus congestion. NECK: No pain or stiffness. CARDIOVASCULAR: No chest pain or pressure. No palpitations. PULMONARY: No  shortness of breath, cough or wheeze. GASTROINTESTINAL: No abdominal pain, nausea, vomiting or diarrhea, melena or bright red blood per rectum. GENITOURINARY: No urinary frequency, urgency, hesitancy or dysuria. MUSCULOSKELETAL: No joint or muscle pain, no back pain, no recent trauma. DERMATOLOGIC: No rash, no itching, no lesions. ENDOCRINE: No polyuria, polydipsia, no heat or cold intolerance. No recent change in weight. HEMATOLOGICAL: No anemia or easy bruising or bleeding. NEUROLOGIC: No headache, seizures, numbness, tingling or weakness. PSYCHIATRIC: No depression, no loss of interest in normal activity or change in sleep pattern.     Exam: chaperone present  BP 126/78   Ht 5\' 7"  (1.702 m)   Wt 140 lb (63.5 kg)   BMI 21.93 kg/m   Body mass index is 21.93 kg/m.  General appearance : Well developed well nourished female. No acute distress HEENT: Eyes: no retinal hemorrhage or exudates,  Neck supple, trachea midline, no carotid bruits, no thyroidmegaly Lungs: Clear to auscultation, no rhonchi or wheezes, or rib retractions  Heart: Regular rate and rhythm, no murmurs or gallops Breast:Examined in sitting and supine position were symmetrical in appearance, no palpable masses or tenderness,  no skin retraction, no nipple inversion, no nipple discharge, no skin discoloration, no axillary or supraclavicular lymphadenopathy Abdomen: no palpable masses or tenderness, no rebound or guarding Extremities: no edema or skin discoloration or tenderness  Pelvic:  Bartholin, Urethra, Skene Glands: Within normal limits             Vagina: No gross lesions or discharge, atrophic changes  Cervix: No gross lesions or discharge  Uterus  axial, normal size, shape and consistency, non-tender and mobile  Adnexa  Without masses or tenderness  Anus and perineum  normal   Rectovaginal  normal sphincter tone without palpated masses or tenderness             Hemoccult PCP will provide patient to schedule  colonoscopy this year     Assessment/Plan:  65 y.o. female for annual exam with past history of colon polyps had benign polyps removed in 2014 she is on a 5 year recall she'll be contacting her gastroenterologist. [name] also given her requisition to schedule her bone density study which is overdue. Also patient will stop by the labs we can check her vitamin D level. Patient will use Replens vaginal moisturizer instead of any estrogen cream for vaginal dryness. She'll also use Astro glide when necessary intercourse. Patient anxious because of her mother's history of cervical versus uterine cancer wanted to have her Pap smear every year's a Pap smear was done today. We did discuss the new guidelines. We discussed importance of calcium vitamin D and weightbearing exercises for osteoporosis prevention. Patient also was given a requisition to schedule her overdue mammogram as well. Patient had interested in flu vaccine.   Terrance Mass MD, 12:05 PM 02/20/2016

## 2016-02-21 LAB — PAP IG W/ RFLX HPV ASCU

## 2016-02-21 LAB — VITAMIN D 25 HYDROXY (VIT D DEFICIENCY, FRACTURES): Vit D, 25-Hydroxy: 59 ng/mL (ref 30–100)

## 2016-04-03 ENCOUNTER — Encounter: Payer: Self-pay | Admitting: Gynecology

## 2016-04-03 ENCOUNTER — Other Ambulatory Visit: Payer: Self-pay | Admitting: Gynecology

## 2016-04-03 ENCOUNTER — Ambulatory Visit (INDEPENDENT_AMBULATORY_CARE_PROVIDER_SITE_OTHER): Payer: Medicare Other

## 2016-04-03 DIAGNOSIS — M8580 Other specified disorders of bone density and structure, unspecified site: Secondary | ICD-10-CM

## 2016-04-03 DIAGNOSIS — Z780 Asymptomatic menopausal state: Secondary | ICD-10-CM | POA: Diagnosis not present

## 2016-04-03 DIAGNOSIS — Z8639 Personal history of other endocrine, nutritional and metabolic disease: Secondary | ICD-10-CM

## 2016-05-08 ENCOUNTER — Telehealth: Payer: Self-pay | Admitting: *Deleted

## 2016-05-08 NOTE — Telephone Encounter (Signed)
Call in prescription for Macrobid 100 mg. Call in 30 tablets with 2 refills. Tell patient take 1 antibiotic tablet after intercourse

## 2016-05-08 NOTE — Telephone Encounter (Signed)
Pt said you spoke with her about taking medication after sex to prevent UTI's back in Sept annual visit, declined at the time, pt now would like Rx for this. Please advise

## 2016-05-09 MED ORDER — NITROFURANTOIN MONOHYD MACRO 100 MG PO CAPS: ORAL_CAPSULE | ORAL | 2 refills | Status: DC

## 2016-05-09 NOTE — Telephone Encounter (Signed)
Left on voicemail Rx has been sent. 

## 2016-09-21 DIAGNOSIS — Z01818 Encounter for other preprocedural examination: Secondary | ICD-10-CM | POA: Insufficient documentation

## 2016-09-21 DIAGNOSIS — Z8659 Personal history of other mental and behavioral disorders: Secondary | ICD-10-CM | POA: Insufficient documentation

## 2016-09-21 DIAGNOSIS — Z8619 Personal history of other infectious and parasitic diseases: Secondary | ICD-10-CM | POA: Insufficient documentation

## 2016-10-10 DIAGNOSIS — M4326 Fusion of spine, lumbar region: Secondary | ICD-10-CM | POA: Insufficient documentation

## 2016-10-24 ENCOUNTER — Encounter: Payer: Self-pay | Admitting: Gynecology

## 2016-11-09 HISTORY — PX: LUMBAR FUSION: SHX111

## 2017-02-20 ENCOUNTER — Encounter: Payer: Medicare Other | Admitting: Gynecology

## 2017-02-26 ENCOUNTER — Encounter: Payer: Medicare Other | Admitting: Gynecology

## 2017-04-10 ENCOUNTER — Encounter: Payer: Medicare Other | Admitting: Gynecology

## 2017-04-22 ENCOUNTER — Encounter: Payer: Medicare Other | Admitting: Gynecology

## 2017-05-10 ENCOUNTER — Encounter: Payer: Medicare Other | Admitting: Gynecology

## 2017-07-09 ENCOUNTER — Ambulatory Visit (INDEPENDENT_AMBULATORY_CARE_PROVIDER_SITE_OTHER): Payer: Medicare Other | Admitting: Obstetrics & Gynecology

## 2017-07-09 ENCOUNTER — Encounter: Payer: Self-pay | Admitting: Obstetrics & Gynecology

## 2017-07-09 VITALS — BP 122/70 | Ht 66.5 in | Wt 137.0 lb

## 2017-07-09 DIAGNOSIS — Z01419 Encounter for gynecological examination (general) (routine) without abnormal findings: Secondary | ICD-10-CM | POA: Diagnosis not present

## 2017-07-09 DIAGNOSIS — Z1151 Encounter for screening for human papillomavirus (HPV): Secondary | ICD-10-CM

## 2017-07-09 DIAGNOSIS — Z780 Asymptomatic menopausal state: Secondary | ICD-10-CM

## 2017-07-09 DIAGNOSIS — Z124 Encounter for screening for malignant neoplasm of cervix: Secondary | ICD-10-CM

## 2017-07-09 DIAGNOSIS — M85851 Other specified disorders of bone density and structure, right thigh: Secondary | ICD-10-CM

## 2017-07-09 DIAGNOSIS — M85852 Other specified disorders of bone density and structure, left thigh: Secondary | ICD-10-CM

## 2017-07-09 NOTE — Patient Instructions (Signed)
1. Encounter for routine gynecological examination with Papanicolaou smear of cervix Normal gynecologic exam.  Pap test with high risk HPV done.  Breast exam normal.  Patient will organize her screening mammogram.  Will also establish with a family physician.  Will do health labs with family physician.  Will organize screening colonoscopy.  Recommend an increase in aerobic physical activity to 5 times a week and weightlifting every 2 days.  2. Menopause present Well on no hormone replacement therapy.  No postmenopausal bleeding.  3. Osteopenia of necks of both femurs Taking vitamin D supplements, calcium rich nutrition.  Increase weightbearing physical activity.  Last bone density October 2017 showed only osteopenia.  Will repeat bone density after October 2019.  Molly Ortiz, it was really a pleasure meeting you today!  I will inform you of your results as soon as they are available.   Health Maintenance for Postmenopausal Women Menopause is a normal process in which your reproductive ability comes to an end. This process happens gradually over a span of months to years, usually between the ages of 28 and 62. Menopause is complete when you have missed 12 consecutive menstrual periods. It is important to talk with your health care provider about some of the most common conditions that affect postmenopausal women, such as heart disease, cancer, and bone loss (osteoporosis). Adopting a healthy lifestyle and getting preventive care can help to promote your health and wellness. Those actions can also lower your chances of developing some of these common conditions. What should I know about menopause? During menopause, you may experience a number of symptoms, such as:  Moderate-to-severe hot flashes.  Night sweats.  Decrease in sex drive.  Mood swings.  Headaches.  Tiredness.  Irritability.  Memory problems.  Insomnia.  Choosing to treat or not to treat menopausal changes is an individual  decision that you make with your health care provider. What should I know about hormone replacement therapy and supplements? Hormone therapy products are effective for treating symptoms that are associated with menopause, such as hot flashes and night sweats. Hormone replacement carries certain risks, especially as you become older. If you are thinking about using estrogen or estrogen with progestin treatments, discuss the benefits and risks with your health care provider. What should I know about heart disease and stroke? Heart disease, heart attack, and stroke become more likely as you age. This may be due, in part, to the hormonal changes that your body experiences during menopause. These can affect how your body processes dietary fats, triglycerides, and cholesterol. Heart attack and stroke are both medical emergencies. There are many things that you can do to help prevent heart disease and stroke:  Have your blood pressure checked at least every 1-2 years. High blood pressure causes heart disease and increases the risk of stroke.  If you are 17-32 years old, ask your health care provider if you should take aspirin to prevent a heart attack or a stroke.  Do not use any tobacco products, including cigarettes, chewing tobacco, or electronic cigarettes. If you need help quitting, ask your health care provider.  It is important to eat a healthy diet and maintain a healthy weight. ? Be sure to include plenty of vegetables, fruits, low-fat dairy products, and lean protein. ? Avoid eating foods that are high in solid fats, added sugars, or salt (sodium).  Get regular exercise. This is one of the most important things that you can do for your health. ? Try to exercise for at least  150 minutes each week. The type of exercise that you do should increase your heart rate and make you sweat. This is known as moderate-intensity exercise. ? Try to do strengthening exercises at least twice each week. Do these  in addition to the moderate-intensity exercise.  Know your numbers.Ask your health care provider to check your cholesterol and your blood glucose. Continue to have your blood tested as directed by your health care provider.  What should I know about cancer screening? There are several types of cancer. Take the following steps to reduce your risk and to catch any cancer development as early as possible. Breast Cancer  Practice breast self-awareness. ? This means understanding how your breasts normally appear and feel. ? It also means doing regular breast self-exams. Let your health care provider know about any changes, no matter how small.  If you are 35 or older, have a clinician do a breast exam (clinical breast exam or CBE) every year. Depending on your age, family history, and medical history, it may be recommended that you also have a yearly breast X-ray (mammogram).  If you have a family history of breast cancer, talk with your health care provider about genetic screening.  If you are at high risk for breast cancer, talk with your health care provider about having an MRI and a mammogram every year.  Breast cancer (BRCA) gene test is recommended for women who have family members with BRCA-related cancers. Results of the assessment will determine the need for genetic counseling and BRCA1 and for BRCA2 testing. BRCA-related cancers include these types: ? Breast. This occurs in males or females. ? Ovarian. ? Tubal. This may also be called fallopian tube cancer. ? Cancer of the abdominal or pelvic lining (peritoneal cancer). ? Prostate. ? Pancreatic.  Cervical, Uterine, and Ovarian Cancer Your health care provider may recommend that you be screened regularly for cancer of the pelvic organs. These include your ovaries, uterus, and vagina. This screening involves a pelvic exam, which includes checking for microscopic changes to the surface of your cervix (Pap test).  For women ages 21-65,  health care providers may recommend a pelvic exam and a Pap test every three years. For women ages 58-65, they may recommend the Pap test and pelvic exam, combined with testing for human papilloma virus (HPV), every five years. Some types of HPV increase your risk of cervical cancer. Testing for HPV may also be done on women of any age who have unclear Pap test results.  Other health care providers may not recommend any screening for nonpregnant women who are considered low risk for pelvic cancer and have no symptoms. Ask your health care provider if a screening pelvic exam is right for you.  If you have had past treatment for cervical cancer or a condition that could lead to cancer, you need Pap tests and screening for cancer for at least 20 years after your treatment. If Pap tests have been discontinued for you, your risk factors (such as having a new sexual partner) need to be reassessed to determine if you should start having screenings again. Some women have medical problems that increase the chance of getting cervical cancer. In these cases, your health care provider may recommend that you have screening and Pap tests more often.  If you have a family history of uterine cancer or ovarian cancer, talk with your health care provider about genetic screening.  If you have vaginal bleeding after reaching menopause, tell your health care provider.  There are currently no reliable tests available to screen for ovarian cancer.  Lung Cancer Lung cancer screening is recommended for adults 89-4 years old who are at high risk for lung cancer because of a history of smoking. A yearly low-dose CT scan of the lungs is recommended if you:  Currently smoke.  Have a history of at least 30 pack-years of smoking and you currently smoke or have quit within the past 15 years. A pack-year is smoking an average of one pack of cigarettes per day for one year.  Yearly screening should:  Continue until it has been  15 years since you quit.  Stop if you develop a health problem that would prevent you from having lung cancer treatment.  Colorectal Cancer  This type of cancer can be detected and can often be prevented.  Routine colorectal cancer screening usually begins at age 31 and continues through age 79.  If you have risk factors for colon cancer, your health care provider may recommend that you be screened at an earlier age.  If you have a family history of colorectal cancer, talk with your health care provider about genetic screening.  Your health care provider may also recommend using home test kits to check for hidden blood in your stool.  A small camera at the end of a tube can be used to examine your colon directly (sigmoidoscopy or colonoscopy). This is done to check for the earliest forms of colorectal cancer.  Direct examination of the colon should be repeated every 5-10 years until age 32. However, if early forms of precancerous polyps or small growths are found or if you have a family history or genetic risk for colorectal cancer, you may need to be screened more often.  Skin Cancer  Check your skin from head to toe regularly.  Monitor any moles. Be sure to tell your health care provider: ? About any new moles or changes in moles, especially if there is a change in a mole's shape or color. ? If you have a mole that is larger than the size of a pencil eraser.  If any of your family members has a history of skin cancer, especially at a young age, talk with your health care provider about genetic screening.  Always use sunscreen. Apply sunscreen liberally and repeatedly throughout the day.  Whenever you are outside, protect yourself by wearing long sleeves, pants, a wide-brimmed hat, and sunglasses.  What should I know about osteoporosis? Osteoporosis is a condition in which bone destruction happens more quickly than new bone creation. After menopause, you may be at an increased  risk for osteoporosis. To help prevent osteoporosis or the bone fractures that can happen because of osteoporosis, the following is recommended:  If you are 66-1 years old, get at least 1,000 mg of calcium and at least 600 mg of vitamin D per day.  If you are older than age 80 but younger than age 60, get at least 1,200 mg of calcium and at least 600 mg of vitamin D per day.  If you are older than age 38, get at least 1,200 mg of calcium and at least 800 mg of vitamin D per day.  Smoking and excessive alcohol intake increase the risk of osteoporosis. Eat foods that are rich in calcium and vitamin D, and do weight-bearing exercises several times each week as directed by your health care provider. What should I know about how menopause affects my mental health? Depression may occur at any  age, but it is more common as you become older. Common symptoms of depression include:  Low or sad mood.  Changes in sleep patterns.  Changes in appetite or eating patterns.  Feeling an overall lack of motivation or enjoyment of activities that you previously enjoyed.  Frequent crying spells.  Talk with your health care provider if you think that you are experiencing depression. What should I know about immunizations? It is important that you get and maintain your immunizations. These include:  Tetanus, diphtheria, and pertussis (Tdap) booster vaccine.  Influenza every year before the flu season begins.  Pneumonia vaccine.  Shingles vaccine.  Your health care provider may also recommend other immunizations. This information is not intended to replace advice given to you by your health care provider. Make sure you discuss any questions you have with your health care provider. Document Released: 07/20/2005 Document Revised: 12/16/2015 Document Reviewed: 03/01/2015 Elsevier Interactive Patient Education  2018 [employer].

## 2017-07-09 NOTE — Progress Notes (Signed)
Molly Ortiz [birth year] [id number]   History:    67 y.o. 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  [employer] trader.  RP:  Established patient presenting for annual gyn exam   HPI: Menopause, well on no hormone replacement therapy.  No postmenopausal bleeding.  No pelvic pain.  No pain with intercourse.  Normal vaginal secretions.  Urine and bowel movements normal.  Breasts normal.  Will establish with a family physician and do health labs there.  Due for colonoscopy, will organize.  Body mass index 21.78.  Planning to exercise more regularly.  Taking vitamin D supplements.  Drinks milk every day.  Patient's mother died of a metastatic cancer which was probably cervical or uterine in origin.  Past medical history,surgical history, family history and social history were all reviewed and documented in the EPIC chart.  Gynecologic History No LMP recorded. Patient is postmenopausal. Contraception: post menopausal status Last Pap: 02/2016. Results were: Negative Last mammogram: 6/201. Results were: Negative Bone Density: 03/2016 Osteopenia at bilateral femoral neck Colonoscopy: 6 years ago.  Benign polyps removed.  Obstetric History OB History  Gravida Para Term Preterm AB Living  7       7 0  SAB TAB Ectopic Multiple Live Births  6 1          # Outcome Date GA Lbr Len/2nd Weight Sex Delivery Anes PTL Lv  7 TAB           6 SAB           5 SAB           4 SAB           3 SAB           2 SAB           1 SAB                ROS: A ROS was performed and pertinent positives and negatives are included in the history.  GENERAL: No fevers or chills. HEENT: No change in vision, no earache, sore throat or sinus congestion. NECK: No pain or stiffness. CARDIOVASCULAR: No chest pain or pressure. No palpitations. PULMONARY: No shortness of breath, cough or wheeze. GASTROINTESTINAL: No abdominal pain, nausea, vomiting or diarrhea, melena or bright red blood per rectum. GENITOURINARY: No urinary frequency,  urgency, hesitancy or dysuria. MUSCULOSKELETAL: No joint or muscle pain, no back pain, no recent trauma. DERMATOLOGIC: No rash, no itching, no lesions. ENDOCRINE: No polyuria, polydipsia, no heat or cold intolerance. No recent change in weight. HEMATOLOGICAL: No anemia or easy bruising or bleeding. NEUROLOGIC: No headache, seizures, numbness, tingling or weakness. PSYCHIATRIC: No depression, no loss of interest in normal activity or change in sleep pattern.     Exam:   BP 122/70   Ht 5' 6.5" (1.689 m)   Wt 137 lb (62.1 kg)   BMI 21.78 kg/m   Body mass index is 21.78 kg/m.  General appearance : Well developed well nourished female. No acute distress HEENT: Eyes: no retinal hemorrhage or exudates,  Neck supple, trachea midline, no carotid bruits, no thyroidmegaly Lungs: Clear to auscultation, no rhonchi or wheezes, or rib retractions  Heart: Regular rate and rhythm, no murmurs or gallops Breast:Examined in sitting and supine position were symmetrical in appearance, no palpable masses or tenderness,  no skin retraction, no nipple inversion, no nipple discharge, no skin discoloration, no axillary or supraclavicular lymphadenopathy Abdomen: no palpable masses or tenderness, no rebound or guarding Extremities: no edema or  skin discoloration or tenderness  Pelvic: Vulva: Normal             Vagina: No gross lesions or discharge  Cervix: No gross lesions or discharge.  Pap/HR HPV done  Uterus  AV, normal size, shape and consistency, non-tender and mobile  Adnexa  Without masses or tenderness  Anus: Normal   Assessment/Plan:  67 y.o. female for annual exam   1. Encounter for routine gynecological examination with Papanicolaou smear of cervix Normal gynecologic exam.  Pap test with high risk HPV done.  Breast exam normal.  Patient will organize her screening mammogram.  Will also establish with a family physician.  Will do health labs with family physician.  Will organize screening  colonoscopy.  Recommend an increase in aerobic physical activity to 5 times a week and weightlifting every 2 days.  2. Menopause present Well on no hormone replacement therapy.  No postmenopausal bleeding.  3. Osteopenia of necks of both femurs Taking vitamin D supplements, calcium rich nutrition.  Increase weightbearing physical activity.  Last bone density October 2017 showed only osteopenia.  Will repeat bone density after October 2019.  Princess Bruins MD, 3:40 PM 07/09/2017

## 2017-07-10 NOTE — Addendum Note (Signed)
Addended by: Thurnell Garbe A on: 07/10/2017 08:40 AM   Modules accepted: Orders

## 2017-07-12 LAB — PAP, TP IMAGING W/ HPV RNA, RFLX HPV TYPE 16,18/45: HPV DNA High Risk: NOT DETECTED

## 2017-07-24 ENCOUNTER — Ambulatory Visit (INDEPENDENT_AMBULATORY_CARE_PROVIDER_SITE_OTHER): Payer: Medicare Other | Admitting: Women's Health

## 2017-07-24 DIAGNOSIS — R300 Dysuria: Secondary | ICD-10-CM

## 2017-07-24 MED ORDER — SULFAMETHOXAZOLE-TRIMETHOPRIM 800-160 MG PO TABS: 1.0000 | ORAL_TABLET | Freq: Two times a day (BID) | ORAL | 0 refills | Status: DC

## 2017-07-24 NOTE — Progress Notes (Signed)
67 year old MWF presents with complaint of increased urinary frequency with burning for the past 3 days , has used Macrodantin 50 mg twice daily with some relief. History of recurrent UTIs, uses Macrodantin 50 mg with intercourse. Denies back pain, fever, vaginal discharge or abdominal pain. Going out of town and is worried symptoms will become worse. Postmenopausal on no HRT with no bleeding. Anxiety and depression  stable on medication.  Exam: Appears well. No CVAT. Abdomen soft nontender.  UA: Trace leukocytes, 6-10 WBCs, no RBCs, 6-10 squamous epithelial days, few bacteria  Possible UTI  Plan: Prescription for Septra twice daily for 3 days #6 given, urine culture pending, reviewed best to wait for culture results, increase fluids, over-the-counter Azo as needed. Aware of UTI prevention.Marland Kitchen

## 2017-07-24 NOTE — Patient Instructions (Signed)

## 2017-07-26 LAB — URINALYSIS, COMPLETE W/RFL CULTURE
Bilirubin Urine: NEGATIVE
Glucose, UA: NEGATIVE
Hgb urine dipstick: NEGATIVE
Hyaline Cast: NONE SEEN /LPF
Ketones, ur: NEGATIVE
Nitrites, Initial: NEGATIVE
Protein, ur: NEGATIVE
RBC / HPF: NONE SEEN /HPF (ref 0–2)
Specific Gravity, Urine: 1.025 (ref 1.001–1.03)
pH: 5 (ref 5.0–8.0)

## 2017-07-26 LAB — URINE CULTURE
MICRO NUMBER:: 90199055
Result:: NO GROWTH
SPECIMEN QUALITY:: ADEQUATE

## 2017-07-26 LAB — CULTURE INDICATED

## 2018-05-26 ENCOUNTER — Other Ambulatory Visit: Payer: Self-pay | Admitting: Obstetrics & Gynecology

## 2018-05-26 DIAGNOSIS — Z1231 Encounter for screening mammogram for malignant neoplasm of breast: Secondary | ICD-10-CM

## 2018-07-02 ENCOUNTER — Ambulatory Visit
Admission: RE | Admit: 2018-07-02 | Discharge: 2018-07-02 | Disposition: A | Discharge status: Home / Self Care | Payer: Medicare Other | Source: Ambulatory Visit | Attending: Obstetrics & Gynecology | Admitting: Obstetrics & Gynecology

## 2018-07-02 DIAGNOSIS — Z1231 Encounter for screening mammogram for malignant neoplasm of breast: Secondary | ICD-10-CM

## 2018-08-04 ENCOUNTER — Encounter: Payer: Medicare Other | Admitting: Obstetrics & Gynecology

## 2018-08-07 ENCOUNTER — Ambulatory Visit (INDEPENDENT_AMBULATORY_CARE_PROVIDER_SITE_OTHER): Payer: Medicare Other | Admitting: Obstetrics & Gynecology

## 2018-08-07 ENCOUNTER — Encounter: Payer: Medicare Other | Admitting: Obstetrics & Gynecology

## 2018-08-07 ENCOUNTER — Encounter: Payer: Self-pay | Admitting: Obstetrics & Gynecology

## 2018-08-07 VITALS — BP 138/74 | Ht 67.5 in | Wt 136.8 lb

## 2018-08-07 DIAGNOSIS — J0100 Acute maxillary sinusitis, unspecified: Secondary | ICD-10-CM | POA: Diagnosis not present

## 2018-08-07 DIAGNOSIS — Z01419 Encounter for gynecological examination (general) (routine) without abnormal findings: Secondary | ICD-10-CM

## 2018-08-07 DIAGNOSIS — N952 Postmenopausal atrophic vaginitis: Secondary | ICD-10-CM

## 2018-08-07 DIAGNOSIS — Z780 Asymptomatic menopausal state: Secondary | ICD-10-CM | POA: Diagnosis not present

## 2018-08-07 DIAGNOSIS — M8589 Other specified disorders of bone density and structure, multiple sites: Secondary | ICD-10-CM | POA: Diagnosis not present

## 2018-08-07 MED ORDER — AMOXICILLIN 500 MG PO TABS: 500.0000 mg | ORAL_TABLET | Freq: Two times a day (BID) | ORAL | 0 refills | Status: AC

## 2018-08-07 NOTE — Patient Instructions (Signed)
1. Well female exam with routine gynecological exam Normal gynecologic exam.  Pap test was negative with negative high-risk HPV in January 2019, will repeat next year.  Breast exam normal.  Screening mammogram in January 2020 was negative.  Colono scheduled next week.  Health labs with family physician.  2. Postmenopausal Well on no hormone replacement therapy.  No postmenopausal bleeding.  3. Postmenopausal atrophic vaginitis Complains of dryness and pain with intercourse.  Will try Coconut oil, if not sufficient will call for a prescription of a compound estrogen cream.  4. Osteopenia of multiple sites Recommend calcium intake of 1.5 g/day total and vitamin D supplements.  Continue with regular weightbearing physical activities.  Will schedule a BD here now. - DG Bone Density; Future  5. Acute non-recurrent maxillary sinusitis Will treat with Amoxyl, but instructed to schedule a visit with her Fam MD for evaluation.  Other orders - buPROPion (WELLBUTRIN XL) 300 MG 24 hr tablet; Take by mouth. - ALPRAZolam (XANAX) 0.5 MG tablet; Take 0.5 mg by mouth at bedtime as needed for anxiety. - amoxicillin (AMOXIL) 500 MG tablet; Take 1 tablet (500 mg total) by mouth 2 (two) times daily for 10 days.  Molly Ortiz, it was a pleasure seeing you today!

## 2018-08-07 NOTE — Progress Notes (Signed)
Molly Ortiz 05-Apr-1951 [id number]   History:    68 y.o. G7P0A7  Longstanding boyfriend  RP:  Established patient presenting for annual gyn exam   HPI: Menopause, well on no hormone replacement therapy.  No postmenopausal bleeding.  No pelvic pain.  Does complain of dryness/pain with IC.  Complains of a head cold about a month ago which now is at the level of her sinuses where she feels tender and feels that her eyes are warm.  Mild congestion.  No fever.  Urine and bowel movements normal.  Breasts normal.  Body mass index 21.11.  Good fitness and healthy nutrition.  Health labs with family physician.  Past medical history,surgical history, family history and social history were all reviewed and documented in the EPIC chart.  Gynecologic History No LMP recorded. Patient is postmenopausal. Contraception: post menopausal status Last Pap: 06/2017. Results were: Negative/HPV HR neg Last mammogram: 06/2018. Results were: Negative Bone Density: 03/2016 Osteopenia Colonoscopy: Scheduled for next week  Obstetric History OB History  Gravida Para Term Preterm AB Living  7       7 0  SAB TAB Ectopic Multiple Live Births  6 1          # Outcome Date GA Lbr Len/2nd Weight Sex Delivery Anes PTL Lv  7 TAB           6 SAB           5 SAB           4 SAB           3 SAB           2 SAB           1 SAB              ROS: A ROS was performed and pertinent positives and negatives are included in the history.  GENERAL: No fevers or chills. HEENT: No change in vision, no earache, sore throat or sinus congestion. NECK: No pain or stiffness. CARDIOVASCULAR: No chest pain or pressure. No palpitations. PULMONARY: No shortness of breath, cough or wheeze. GASTROINTESTINAL: No abdominal pain, nausea, vomiting or diarrhea, melena or bright red blood per rectum. GENITOURINARY: No urinary frequency, urgency, hesitancy or dysuria. MUSCULOSKELETAL: No joint or muscle pain, no back pain, no recent trauma.  DERMATOLOGIC: No rash, no itching, no lesions. ENDOCRINE: No polyuria, polydipsia, no heat or cold intolerance. No recent change in weight. HEMATOLOGICAL: No anemia or easy bruising or bleeding. NEUROLOGIC: No headache, seizures, numbness, tingling or weakness. PSYCHIATRIC: No depression, no loss of interest in normal activity or change in sleep pattern.     Exam:   BP 138/74   Ht 5' 7.5" (1.715 m)   Wt 136 lb 12.8 oz (62.1 kg)   BMI 21.11 kg/m   Body mass index is 21.11 kg/m.  General appearance : Well developed well nourished female. No acute distress HEENT: Eyes: no retinal hemorrhage or exudates,  Neck supple, trachea midline, no carotid bruits, no thyroidmegaly Lungs: Clear to auscultation, no rhonchi or wheezes, or rib retractions  Heart: Regular rate and rhythm, no murmurs or gallops Breast:Examined in sitting and supine position were symmetrical in appearance, no palpable masses or tenderness,  no skin retraction, no nipple inversion, no nipple discharge, no skin discoloration, no axillary or supraclavicular lymphadenopathy Abdomen: no palpable masses or tenderness, no rebound or guarding Extremities: no edema or skin discoloration or tenderness  Pelvic: Vulva: Normal  Vagina: No gross lesions or discharge  Cervix: No gross lesions or discharge  Uterus  AV, normal size, shape and consistency, non-tender and mobile  Adnexa  Without masses or tenderness  Anus: Normal   Assessment/Plan:  68 y.o. female for annual exam   1. Well female exam with routine gynecological exam Normal gynecologic exam.  Pap test was negative with negative high-risk HPV in January 2019, will repeat next year.  Breast exam normal.  Screening mammogram in January 2020 was negative.  Colono scheduled next week.  Health labs with family physician.  2. Postmenopausal Well on no hormone replacement therapy.  No postmenopausal bleeding.  3. Postmenopausal atrophic vaginitis Complains of  dryness and pain with intercourse.  Will try Coconut oil, if not sufficient will call for a prescription of a compound estrogen cream.  4. Osteopenia of multiple sites Recommend calcium intake of 1.5 g/day total and vitamin D supplements.  Continue with regular weightbearing physical activities.  Will schedule a BD here now. - DG Bone Density; Future  5. Acute non-recurrent maxillary sinusitis Will treat with Amoxyl, but instructed to schedule a visit with her Fam MD for evaluation.  Other orders - buPROPion (WELLBUTRIN XL) 300 MG 24 hr tablet; Take by mouth. - ALPRAZolam (XANAX) 0.5 MG tablet; Take 0.5 mg by mouth at bedtime as needed for anxiety. - amoxicillin (AMOXIL) 500 MG tablet; Take 1 tablet (500 mg total) by mouth 2 (two) times daily for 10 days.  Counseling on above issues >50% x 10 minutes.  Princess Bruins MD, 4:23 PM 08/07/2018

## 2018-08-13 ENCOUNTER — Encounter (INDEPENDENT_AMBULATORY_CARE_PROVIDER_SITE_OTHER): Payer: Medicare Other

## 2018-08-13 DIAGNOSIS — Z780 Asymptomatic menopausal state: Secondary | ICD-10-CM

## 2018-08-13 DIAGNOSIS — M8589 Other specified disorders of bone density and structure, multiple sites: Secondary | ICD-10-CM

## 2018-08-20 ENCOUNTER — Telehealth: Payer: Self-pay | Admitting: *Deleted

## 2018-08-20 DIAGNOSIS — R002 Palpitations: Secondary | ICD-10-CM

## 2018-08-20 NOTE — Telephone Encounter (Signed)
Patient called c/o heart palpations about 5 days ago, family history of PVC'S asked if she can have referral to see Dr. Dudley Major with [facility]? Patient does not have PCP.  Please advise

## 2018-08-22 NOTE — Telephone Encounter (Signed)
Yes agree with Cardio referral.

## 2018-08-22 NOTE — Telephone Encounter (Signed)
Patient aware, referral placed at [facility]. [address]

## 2018-09-16 NOTE — Telephone Encounter (Signed)
Per [facility] message left for patient to call by Dr. Rayann Heman office

## 2019-03-18 ENCOUNTER — Encounter: Payer: Self-pay | Admitting: Gynecology

## 2019-05-15 ENCOUNTER — Other Ambulatory Visit: Payer: Self-pay | Admitting: General Practice

## 2019-05-15 DIAGNOSIS — Z20822 Contact with and (suspected) exposure to covid-19: Secondary | ICD-10-CM

## 2019-05-18 LAB — NOVEL CORONAVIRUS, NAA: SARS-CoV-2, NAA: NOT DETECTED

## 2019-06-26 ENCOUNTER — Other Ambulatory Visit: Payer: Self-pay | Admitting: Obstetrics & Gynecology

## 2019-06-26 DIAGNOSIS — Z1231 Encounter for screening mammogram for malignant neoplasm of breast: Secondary | ICD-10-CM

## 2019-07-21 ENCOUNTER — Ambulatory Visit: Payer: Medicare Other | Attending: Internal Medicine

## 2019-07-21 DIAGNOSIS — Z23 Encounter for immunization: Secondary | ICD-10-CM | POA: Insufficient documentation

## 2019-07-21 NOTE — Progress Notes (Signed)
   Covid-19 Vaccination Clinic  Name:  Molly Ortiz    MRN: [id number] DOB: [birth year]  07/21/2019  [name] was observed post Covid-19 immunization for 15 minutes without incidence. She was provided with Vaccine Information Sheet and instruction to access the V-Safe system.   [name] was instructed to call 911 with any severe reactions post vaccine: Marland Kitchen Difficulty breathing  . Swelling of your face and throat  . A fast heartbeat  . A bad rash all over your body  . Dizziness and weakness    Immunizations Administered    Name Date Dose VIS Date Route   Pfizer COVID-19 Vaccine 07/21/2019  1:56 PM 0.3 mL 05/22/2019 Intramuscular   Manufacturer: [place]   Lot: [id number]   [place]: [id number]

## 2019-07-23 ENCOUNTER — Ambulatory Visit: Payer: 59

## 2019-08-01 DIAGNOSIS — R519 Headache, unspecified: Secondary | ICD-10-CM | POA: Diagnosis not present

## 2019-08-05 ENCOUNTER — Ambulatory Visit
Admission: RE | Admit: 2019-08-05 | Discharge: 2019-08-05 | Disposition: A | Discharge status: Home / Self Care | Payer: Medicare Other | Source: Ambulatory Visit | Attending: Obstetrics & Gynecology | Admitting: Obstetrics & Gynecology

## 2019-08-05 ENCOUNTER — Other Ambulatory Visit: Payer: Self-pay

## 2019-08-05 DIAGNOSIS — Z1231 Encounter for screening mammogram for malignant neoplasm of breast: Secondary | ICD-10-CM

## 2019-08-15 ENCOUNTER — Ambulatory Visit: Payer: Medicare Other | Attending: Internal Medicine

## 2019-08-15 DIAGNOSIS — Z23 Encounter for immunization: Secondary | ICD-10-CM | POA: Insufficient documentation

## 2019-08-15 NOTE — Progress Notes (Signed)
   Covid-19 Vaccination Clinic  Name:  Oberia Colicchio    MRN: [id number] DOB: [birth year]  08/15/2019  Ms. Morua was observed post Covid-19 immunization for 15 minutes without incident. She was provided with Vaccine Information Sheet and instruction to access the V-Safe system.   Ms. Celik was instructed to call 911 with any severe reactions post vaccine: Marland Kitchen Difficulty breathing  . Swelling of face and throat  . A fast heartbeat  . A bad rash all over body  . Dizziness and weakness   Immunizations Administered    Name Date Dose VIS Date Route   Pfizer COVID-19 Vaccine 08/15/2019  8:54 AM 0.3 mL 05/22/2019 Intramuscular   Manufacturer: [place]   Lot: [id number]   [place]: [id number]

## 2019-09-03 ENCOUNTER — Other Ambulatory Visit: Payer: Self-pay

## 2019-09-04 ENCOUNTER — Ambulatory Visit (INDEPENDENT_AMBULATORY_CARE_PROVIDER_SITE_OTHER): Payer: Medicare Other | Admitting: Obstetrics & Gynecology

## 2019-09-04 ENCOUNTER — Encounter: Payer: Self-pay | Admitting: Obstetrics & Gynecology

## 2019-09-04 VITALS — BP 132/86 | Ht 66.75 in | Wt 137.0 lb

## 2019-09-04 DIAGNOSIS — Z780 Asymptomatic menopausal state: Secondary | ICD-10-CM

## 2019-09-04 DIAGNOSIS — Z01419 Encounter for gynecological examination (general) (routine) without abnormal findings: Secondary | ICD-10-CM | POA: Diagnosis not present

## 2019-09-04 DIAGNOSIS — M8589 Other specified disorders of bone density and structure, multiple sites: Secondary | ICD-10-CM

## 2019-09-04 DIAGNOSIS — Z9189 Other specified personal risk factors, not elsewhere classified: Secondary | ICD-10-CM | POA: Diagnosis not present

## 2019-09-04 NOTE — Patient Instructions (Signed)
1. Encounter for routine gynecological examination with Papanicolaou smear of cervix Normal gynecologic exam in menopause.  Pap reflex done.  Breast exam normal.  Screening mammo 07/2019.  Colono scheduled 09/25/2019 to assure complete removal of a severely Dysplastic Polyp.  Health labs with Fam MD.  Good BMI at 21.62.  Continue with Fitness and Healthy Nutrition.  2. Postmenopause Well on no HRT.  No PMB.  3. Osteopenia of multiple sites Bone Density 08/13/2018 showing Osteopenia at many sites.  Will repeat a Bone Density in 08/2020.  Vit D supplement, Ca++ 1200 mg daily and continue weightbearing physical activity.  Molly Ortiz, it was a pleasure seeing you today!  I will inform you of your results as soon as they are available.

## 2019-09-04 NOTE — Addendum Note (Signed)
Addended by: Thurnell Garbe A on: 09/04/2019 12:54 PM   Modules accepted: Orders

## 2019-09-04 NOTE — Progress Notes (Signed)
Molly Ortiz Jan 23, 1951 [id number]   History:    69 y.o. . G7P0A7  Longstanding boyfriend.  RP:  Established patient presenting for annual gyn exam   HPI: Menopause, well on no hormone replacement therapy.  No postmenopausal bleeding.  No pelvic pain.  Not sexually active x 1 year.  Boyfriend's conspirational theories affecting the relationship.  Urine and bowel movements normal.  Breasts normal.  Body mass index 21.62.  Good fitness and healthy nutrition.  Health labs with family physician.  Colono Excision of borderline Polyp, will repeat a Colono 09/25/2019 to assure complete excision.  Past medical history,surgical history, family history and social history were all reviewed and documented in the EPIC chart.  Gynecologic History No LMP recorded. Patient is postmenopausal.  Obstetric History OB History  Gravida Para Term Preterm AB Living  7       7 0  SAB TAB Ectopic Multiple Live Births  6 1          # Outcome Date GA Lbr Len/2nd Weight Sex Delivery Anes PTL Lv  7 TAB           6 SAB           5 SAB           4 SAB           3 SAB           2 SAB           1 SAB              ROS: A ROS was performed and pertinent positives and negatives are included in the history.  GENERAL: No fevers or chills. HEENT: No change in vision, no earache, sore throat or sinus congestion. NECK: No pain or stiffness. CARDIOVASCULAR: No chest pain or pressure. No palpitations. PULMONARY: No shortness of breath, cough or wheeze. GASTROINTESTINAL: No abdominal pain, nausea, vomiting or diarrhea, melena or bright red blood per rectum. GENITOURINARY: No urinary frequency, urgency, hesitancy or dysuria. MUSCULOSKELETAL: No joint or muscle pain, no back pain, no recent trauma. DERMATOLOGIC: No rash, no itching, no lesions. ENDOCRINE: No polyuria, polydipsia, no heat or cold intolerance. No recent change in weight. HEMATOLOGICAL: No anemia or easy bruising or bleeding. NEUROLOGIC: No headache, seizures,  numbness, tingling or weakness. PSYCHIATRIC: No depression, no loss of interest in normal activity or change in sleep pattern.     Exam:   BP 132/86   Ht 5' 6.75" (1.695 m)   Wt 137 lb (62.1 kg)   BMI 21.62 kg/m   Body mass index is 21.62 kg/m.  General appearance : Well developed well nourished female. No acute distress HEENT: Eyes: no retinal hemorrhage or exudates,  Neck supple, trachea midline, no carotid bruits, no thyroidmegaly Lungs: Clear to auscultation, no rhonchi or wheezes, or rib retractions  Heart: Regular rate and rhythm, no murmurs or gallops Breast:Examined in sitting and supine position were symmetrical in appearance, no palpable masses or tenderness,  no skin retraction, no nipple inversion, no nipple discharge, no skin discoloration, no axillary or supraclavicular lymphadenopathy Abdomen: no palpable masses or tenderness, no rebound or guarding Extremities: no edema or skin discoloration or tenderness  Pelvic: Vulva: Normal             Vagina: No gross lesions or discharge  Cervix: No gross lesions or discharge.  Pap reflex done.  Uterus  AV, normal size, shape and consistency, non-tender and mobile  Adnexa  Without masses  or tenderness  Anus: Normal   Assessment/Plan:  69 y.o. female for annual exam   1. Encounter for routine gynecological examination with Papanicolaou smear of cervix Normal gynecologic exam in menopause.  Pap reflex done.  Breast exam normal.  Screening mammo 07/2019.  Colono scheduled 09/25/2019 to assure complete removal of a severely Dysplastic Polyp.  Health labs with Fam MD.  Good BMI at 21.62.  Continue with Fitness and Healthy Nutrition.  2. Postmenopause Well on no HRT.  No PMB.  3. Osteopenia of multiple sites Bone Density 08/13/2018 showing Osteopenia at many sites.  Will repeat a Bone Density in 08/2020.  Vit D supplement, Ca++ 1200 mg daily and continue weightbearing physical activity.  Princess Bruins MD, 12:20 PM  09/04/2019

## 2019-09-07 LAB — PAP IG W/ RFLX HPV ASCU

## 2019-09-08 ENCOUNTER — Encounter: Payer: Self-pay | Admitting: *Deleted

## 2020-03-25 ENCOUNTER — Ambulatory Visit (INDEPENDENT_AMBULATORY_CARE_PROVIDER_SITE_OTHER): Payer: Medicare Other | Admitting: Podiatry

## 2020-03-25 ENCOUNTER — Other Ambulatory Visit: Payer: Self-pay

## 2020-03-25 ENCOUNTER — Ambulatory Visit (INDEPENDENT_AMBULATORY_CARE_PROVIDER_SITE_OTHER): Payer: Medicare Other

## 2020-03-25 DIAGNOSIS — M79671 Pain in right foot: Secondary | ICD-10-CM

## 2020-03-25 DIAGNOSIS — M722 Plantar fascial fibromatosis: Secondary | ICD-10-CM

## 2020-03-28 ENCOUNTER — Other Ambulatory Visit: Payer: Self-pay | Admitting: Podiatry

## 2020-03-28 DIAGNOSIS — M722 Plantar fascial fibromatosis: Secondary | ICD-10-CM

## 2020-03-29 ENCOUNTER — Encounter: Payer: Self-pay | Admitting: Podiatry

## 2020-03-29 ENCOUNTER — Ambulatory Visit: Payer: Medicare Other | Admitting: Podiatrist

## 2020-03-29 NOTE — Progress Notes (Signed)
  Subjective:  Patient ID: Molly Ortiz, female    DOB: [birth year],  MRN: [id number]  Chief Complaint  Patient presents with  . Foot Pain    Right foot pain on the outside of the heel PT stated that she feels like someone is sticking her with a needle and she has a lof of pain first thing in the morning     69 y.o. female presents with the above complaint.  Patient states it presents with her right heel pain especially on the lateral side of the heel.  Patient states it feels eczema shocking her with a needle.  Pain is forcing in the morning when taking the first step.  She states that she has tried some stretching but she has not done any conservative treatment options.  She would like to discuss various treatment options for was going on.  She has not seen anyone else prior to seeing me.  Pain scale is 7 out of 10 sharp shooting in nature.   Review of Systems: Negative except as noted in the HPI. Denies N/V/F/Ch.  Past Medical History:  Diagnosis Date  . Osteopenia   . Vitamin D deficiency     Current Outpatient Medications:  .  ALPRAZolam (XANAX) 0.5 MG tablet, Take 0.5 mg by mouth at bedtime as needed for anxiety., Disp: , Rfl:  .  buPROPion (WELLBUTRIN XL) 300 MG 24 hr tablet, Take by mouth., Disp: , Rfl:  .  cholecalciferol (VITAMIN D) 1000 units tablet, Take 1,000 Units by mouth daily., Disp: , Rfl:   Social History   Tobacco Use  Smoking Status Never Smoker  Smokeless Tobacco Never Used    No Known Allergies Objective:  There were no vitals filed for this visit. There is no height or weight on file to calculate BMI. Constitutional Well developed. Well nourished.  Vascular Dorsalis pedis pulses palpable bilaterally. Posterior tibial pulses palpable bilaterally. Capillary refill normal to all digits.  No cyanosis or clubbing noted. Pedal hair growth normal.  Neurologic Normal speech. Oriented to person, place, and time. Epicritic sensation to light touch grossly  present bilaterally.  Dermatologic Nails well groomed and normal in appearance. No open wounds. No skin lesions.  Orthopedic: Normal joint ROM without pain or crepitus bilaterally. No visible deformities. Tender to palpation at the calcaneal tuber right. No pain with calcaneal squeeze right. Ankle ROM full range of motion right. Silfverskiold Test: negative right.   Radiographs: Taken and reviewed. No acute fractures or dislocations. No evidence of stress fracture.  Plantar heel spur present. Posterior heel spur absent.   Assessment:   1. Foot pain, right   2. Plantar fasciitis of right foot    Plan:  Patient was evaluated and treated and all questions answered.  Plantar Fasciitis, right - XR reviewed as above.  - Educated on icing and stretching. Instructions given.  - Injection delivered to the plantar fascia as below. - DME: Plantar Fascial Brace - Pharmacologic management: None  Procedure: Injection Tendon/Ligament Location: Right plantar fascia at the glabrous junction; medial approach. Skin Prep: alcohol Injectate: 0.5 cc 0.5% marcaine plain, 0.5 cc of 1% Lidocaine, 0.5 cc kenalog 10. Disposition: Patient tolerated procedure well. Injection site dressed with a band-aid.  No follow-ups on file.

## 2020-04-27 ENCOUNTER — Other Ambulatory Visit: Payer: Self-pay

## 2020-04-27 ENCOUNTER — Encounter: Payer: Self-pay | Admitting: Podiatry

## 2020-04-27 ENCOUNTER — Ambulatory Visit (INDEPENDENT_AMBULATORY_CARE_PROVIDER_SITE_OTHER): Payer: Medicare Other | Admitting: Podiatry

## 2020-04-27 DIAGNOSIS — M79671 Pain in right foot: Secondary | ICD-10-CM | POA: Diagnosis not present

## 2020-04-27 DIAGNOSIS — M722 Plantar fascial fibromatosis: Secondary | ICD-10-CM | POA: Diagnosis not present

## 2020-04-27 NOTE — Progress Notes (Signed)
  Subjective:  Patient ID: Molly Ortiz, female    DOB: [birth year],  MRN: [id number]  Chief Complaint  Patient presents with  . Follow-up    4 week follow up. pt states she still has heel pain.    69 y.o. female presents with the above complaint.  Patient presents with a follow-up of right plantar fasciitis.  Patient states that she states is about the same.  She is try the brace she has tried the injection none of that has helped.  She also went to [place] and did some hiking here and there but she states that her pain is about the same.  She denies any other acute complaints.  She would like to discuss her next treatment options.  Review of Systems: Negative except as noted in the HPI. Denies N/V/F/Ch.  Past Medical History:  Diagnosis Date  . Osteopenia   . Vitamin D deficiency     Current Outpatient Medications:  .  ALPRAZolam (XANAX) 0.5 MG tablet, Take 0.5 mg by mouth at bedtime as needed for anxiety., Disp: , Rfl:  .  buPROPion (WELLBUTRIN XL) 300 MG 24 hr tablet, Take by mouth., Disp: , Rfl:  .  cholecalciferol (VITAMIN D) 1000 units tablet, Take 1,000 Units by mouth daily., Disp: , Rfl:   Social History   Tobacco Use  Smoking Status Never Smoker  Smokeless Tobacco Never Used    No Known Allergies Objective:  There were no vitals filed for this visit. There is no height or weight on file to calculate BMI. Constitutional Well developed. Well nourished.  Vascular Dorsalis pedis pulses palpable bilaterally. Posterior tibial pulses palpable bilaterally. Capillary refill normal to all digits.  No cyanosis or clubbing noted. Pedal hair growth normal.  Neurologic Normal speech. Oriented to person, place, and time. Epicritic sensation to light touch grossly present bilaterally.  Dermatologic Nails well groomed and normal in appearance. No open wounds. No skin lesions.  Orthopedic: Normal joint ROM without pain or crepitus bilaterally. No visible deformities. Tender  to palpation at the calcaneal tuber right. No pain with calcaneal squeeze right. Ankle ROM full range of motion right. Silfverskiold Test: negative right.   Radiographs: Taken and reviewed. No acute fractures or dislocations. No evidence of stress fracture.  Plantar heel spur present. Posterior heel spur absent.   Assessment:   1. Plantar fasciitis of right foot   2. Foot pain, right    Plan:  Patient was evaluated and treated and all questions answered.  Plantar Fasciitis, right - XR reviewed as above.  - Educated on icing and stretching. Instructions given.  -No further injection delivered to the plantar fascia as below. - DME: Patient unable to tolerate the cam boot. - Pharmacologic management: None -I believe she will benefit from physical therapy as she is not able to tolerate the cam boot as well.  Patient agrees with plan would like to pursue physical therapy.  No follow-ups on file.

## 2020-05-23 ENCOUNTER — Other Ambulatory Visit: Payer: Self-pay | Admitting: Family Medicine

## 2020-05-23 DIAGNOSIS — R519 Headache, unspecified: Secondary | ICD-10-CM

## 2020-05-25 ENCOUNTER — Ambulatory Visit: Payer: Medicare Other | Admitting: Podiatry

## 2020-05-26 ENCOUNTER — Ambulatory Visit
Admission: RE | Admit: 2020-05-26 | Discharge: 2020-05-26 | Disposition: A | Discharge status: Home / Self Care | Payer: Medicare Other | Source: Ambulatory Visit | Attending: Family Medicine | Admitting: Family Medicine

## 2020-05-26 ENCOUNTER — Other Ambulatory Visit: Payer: Self-pay

## 2020-05-26 DIAGNOSIS — R519 Headache, unspecified: Secondary | ICD-10-CM

## 2020-05-29 ENCOUNTER — Other Ambulatory Visit: Payer: Medicare Other

## 2020-07-22 DIAGNOSIS — G930 Cerebral cysts: Secondary | ICD-10-CM | POA: Insufficient documentation

## 2020-07-27 ENCOUNTER — Telehealth: Payer: Self-pay | Admitting: Neurology

## 2020-07-27 ENCOUNTER — Encounter: Payer: Self-pay | Admitting: Neurology

## 2020-07-27 ENCOUNTER — Other Ambulatory Visit: Payer: Self-pay | Admitting: Neurosurgery

## 2020-07-27 ENCOUNTER — Encounter: Payer: Self-pay | Admitting: *Deleted

## 2020-07-27 ENCOUNTER — Ambulatory Visit (INDEPENDENT_AMBULATORY_CARE_PROVIDER_SITE_OTHER): Payer: Medicare Other | Admitting: Neurology

## 2020-07-27 VITALS — BP 132/80 | HR 75 | Ht 67.5 in | Wt 140.0 lb

## 2020-07-27 DIAGNOSIS — G44229 Chronic tension-type headache, not intractable: Secondary | ICD-10-CM

## 2020-07-27 DIAGNOSIS — R270 Ataxia, unspecified: Secondary | ICD-10-CM

## 2020-07-27 DIAGNOSIS — M4802 Spinal stenosis, cervical region: Secondary | ICD-10-CM

## 2020-07-27 DIAGNOSIS — R0681 Apnea, not elsewhere classified: Secondary | ICD-10-CM

## 2020-07-27 DIAGNOSIS — M542 Cervicalgia: Secondary | ICD-10-CM

## 2020-07-27 DIAGNOSIS — R2689 Other abnormalities of gait and mobility: Secondary | ICD-10-CM

## 2020-07-27 DIAGNOSIS — R519 Headache, unspecified: Secondary | ICD-10-CM

## 2020-07-27 DIAGNOSIS — R269 Unspecified abnormalities of gait and mobility: Secondary | ICD-10-CM

## 2020-07-27 DIAGNOSIS — G8929 Other chronic pain: Secondary | ICD-10-CM

## 2020-07-27 DIAGNOSIS — G930 Cerebral cysts: Secondary | ICD-10-CM

## 2020-07-27 MED ORDER — ASPIRIN EC 81 MG PO TBEC: 81.0000 mg | DELAYED_RELEASE_TABLET | Freq: Every day | ORAL | 11 refills | Status: AC

## 2020-07-27 NOTE — Progress Notes (Signed)
[facility]    Provider:  Dr Jaynee Eagles Requesting Provider: London Pepper, MD Primary Care Provider:  London Pepper, MD  CC:  Headaches and dizziness  HPI:  Molly Ortiz is a 70 y.o. female here as requested by London Pepper, MD for headaches and dizziness. Past medical history mild hyperlipidemia, headache, bruxism, insomnia, anxiety and depression. Patient reported frequent headaches, accompanied with dizziness, also back pain, muscle tightness, no weakness or numbness, no incontinence, no fever, she also noted left forehead seemed "more prominent", frequent headaches and dizziness, on regular doses of NSAIDs, I reviewed Dr. Horald Pollen examination which was normal including skin, head, eyes, ears, neck, lungs, heart, musculoskeletal, back, extremities and neurologic exam. She also has history of plantar fasciitis. Dr. Arna Snipe discussed medication overuse and rebound headaches and advised that she limit Advil, Tylenol, naproxen or any over-the-counter medication to do 2 doses per week, stable hydrated, started tizanidine.  She started having headaches in the fall, she has a history of tension headaches, worse in her 30s, she started taking OTC medications every day advil for migraine, it "comes and goes", a vice around her head, starts on the left above the eyes, and she has shocks in her head, this headache is always on the left side, she can wake up in the morning and it is there, it is every day, pain every day, she has had vision changes but nothing significant, no jaw pain on chewing, no new neck pain, no fevers. A little bit of throbbing, also a "hot spot" on the top of the head, she is in a bad relationship and that has been stressful. Her sister's husband passed away ([place] rule), she has been stressed out and she hasn;t taken an advil in 3 days, she has been nervous about her headaches.   Reviewed notes, labs and imaging from outside physicians, which showed:  MRI brain  05/26/2020: personally reviewed images and agree with the following:  IMPRESSION: 1. No acute intracranial abnormality. 2. Mild chronic small vessel ischemic disease. 3. 5 cm posterior fossa arachnoid cyst.  From a review of records, medications tried that can be used in headache or migraine management includes: Cymbalta, Prozac, ibuprofen, tizanidine.  Review of Systems: Patient complains of symptoms per HPI as well as the following symptoms imbalance, near falls. Pertinent negatives and positives per HPI. All others negative.   Social History   Socioeconomic History  . Marital status: Single    Spouse name: Not on file  . Number of children: Not on file  . Years of education: Not on file  . Highest education level: Bachelor's degree (e.g., BA, AB, BS)  Occupational History  . Not on file  Tobacco Use  . Smoking status: Never Smoker  . Smokeless tobacco: Never Used  Vaping Use  . Vaping Use: Never used  Substance and Sexual Activity  . Alcohol use: Yes    Alcohol/week: 4.0 standard drinks    Types: 4 Standard drinks or equivalent per week    Comment: was approx 16/week  . Drug use: Never  . Sexual activity: Yes    Partners: Male    Birth control/protection: Post-menopausal    Comment: first sexual encounter at age 75 yrs old . only 5 partners in a lifetime  Other Topics Concern  . Not on file  Social History Narrative   Caffeine: coffee 2 cups per day   Right handed   Lives alone   Social Determinants of Health   Financial Resource Strain: Not on  file  Food Insecurity: Not on file  Transportation Needs: Not on file  Physical Activity: Not on file  Stress: Not on file  Social Connections: Not on file  Intimate Partner Violence: Not on file    Family History  Problem Relation Age of Onset  . Cancer Mother        CERVICAL OR UTERINE  . Diabetes Father   . Hypertension Father   . Cancer Father        LUNG  . Migraines Niece   . Breast cancer Neg Hx      Past Medical History:  Diagnosis Date  . Adenomatous polyps    2 dap's 07/2011 [place] [place], [place]), w/ mediocre prep -per [facility]  . Depression   . Insomnia   . Osteopenia   . Plantar fasciitis   . Vitamin D deficiency     Patient Active Problem List   Diagnosis Date Noted  . Family history of cervical cancer 02/20/2016  . History of vitamin D deficiency 12/20/2014  . Postmenopausal bleeding 11/19/2014  . Family history of uterine cancer 06/26/2013    Past Surgical History:  Procedure Laterality Date  . COLONOSCOPY    . DILATION AND CURETTAGE OF UTERUS     MISSED AB  . LUMBAR FUSION  11/2016  . LUMBAR FUSION  02/2015    Current Outpatient Medications  Medication Sig Dispense Refill  . ALPRAZolam (XANAX) 0.5 MG tablet Take 0.5 mg by mouth at bedtime as needed for anxiety.    Marland Kitchen aspirin EC 81 MG tablet Take 1 tablet (81 mg total) by mouth daily. Swallow whole. 30 tablet 11  . buPROPion (WELLBUTRIN XL) 300 MG 24 hr tablet Take by mouth.    . Cholecalciferol (VITAMIN D) 50 MCG (2000 UT) CAPS 1 capsule    . Multiple Vitamin (MULTIVITAMIN PO) Take by mouth.     No current facility-administered medications for this visit.    Allergies as of 07/27/2020  . (No Known Allergies)    Vitals: BP 132/80 (BP Location: Right Arm, Patient Position: Sitting)   Pulse 75   Ht 5' 7.5" (1.715 m)   Wt 140 lb (63.5 kg)   SpO2 97%   BMI 21.60 kg/m  Last Weight:  Wt Readings from Last 1 Encounters:  07/27/20 140 lb (63.5 kg)   Last Height:   Ht Readings from Last 1 Encounters:  07/27/20 5' 7.5" (1.715 m)     Physical exam: Exam: Gen: NAD, conversant, well nourised, well groomed                     CV: RRR, no MRG. No Carotid Bruits. No peripheral edema, warm, nontender Eyes: Conjunctivae clear without exudates or hemorrhage  Neuro: Detailed Neurologic Exam  Speech:    Speech is normal; fluent and spontaneous with normal comprehension.  Cognition:     The patient is oriented to person, place, and time;     recent and remote memory intact;     language fluent;     normal attention, concentration,     fund of knowledge Cranial Nerves:    The pupils are equal, round, and reactive to light. The fundi are flat. Visual fields are full to finger confrontation. Extraocular movements are intact. Trigeminal sensation is intact and the muscles of mastication are normal. The face is symmetric. The palate elevates in the midline. Hearing intact. Voice is normal. Shoulder shrug is normal. The tongue has normal motion without fasciculations.  Coordination:    Normal finger to nose  Gait:    Slight imbalance.   Motor Observation:    No asymmetry, no atrophy, and no involuntary movements noted. Tone:    Normal muscle tone.    Posture:    Posture is normal. normal erect    Strength:    Strength is V/V in the upper and lower limbs.      Sensation: intact to LT     Reflex Exam:  DTR's: Hypo AJs. 1+ patellars. 2+ biceps, slightly brisker on the left.  Toes:    The toes are downgoing bilaterally.   Clonus:    Clonus is absent.    Assessment/Plan: This is a really lovely 70 year old who looks much younger than stated age here for several concerns.   -Patient with frequent awakenings, morning headaches, she wakes herself up feeling out of breath, stroke found on recent MRI of the brain: We will send her for sleep evaluation -Ataxia neck pain: MRI of the cervical spine to evaluate for cervical stenosis - Lacunar infarct, will review with our vascular neurologist, start aspirin 81 mg, control vascular risk factors especially LDL which is recommended to be less than 70 for secondary stroke prevention. -  Patient with recent stressor in her life and new onset tension type headaches, no significant migrainous features, we discussed headache and migraine treatment possibly with amitriptyline which might also help with her insomnia.  At this time she  states that it is improving and she would not like treatment and thinks it is due to the stress in her life.  We will check an ESR and a CRP although low suspicion for temporal arteritis.   Orders Placed This Encounter  Procedures  . MR CERVICAL SPINE WO CONTRAST  . CBC with Differential/Platelets  . Comprehensive metabolic panel  . Sedimentation rate  . C-reactive protein  . TSH  . Ambulatory referral to Sleep Studies   Meds ordered this encounter  Medications  . aspirin EC 81 MG tablet    Sig: Take 1 tablet (81 mg total) by mouth daily. Swallow whole.    Dispense:  30 tablet    Refill:  11    Cc: London Pepper, MD,  London Pepper, MD  Sarina Ill, MD  [facility] [address] [place], [place] [address]  Phone [phone] Fax [phone]

## 2020-07-27 NOTE — Telephone Encounter (Signed)
Medicare/[employer] supp order sent to GI. No auth they will reach out to the patient to schedule.

## 2020-07-27 NOTE — Patient Instructions (Addendum)
LDL < 70 Start daily ASA 81mg  Sleep doctor Blood work MRI cervical spine: to look for cervical stenosis that could cause imbalance(ataxia)   Lacunar Stroke (I will review with our vascular neurologist next week and let you know if he feels any different about the lacunar stroke report)  A lacunar stroke (lacunar infarction) happens when an area of the brain does not get enough oxygen and blood flow. This can lead to permanent brain damage. A lacunar stroke is a medical emergency. It must be treated right away. What are the causes? This condition is caused by a blockage in a small artery deep in the brain. This may be due to:  A buildup of fatty deposits that causes the arteries to narrow (atherosclerosis). This blocks blood flow in the brain.  High blood pressure. What increases the risk? You are more likely to develop this condition if you:  Have high blood pressure (hypertension).  Have high cholesterol (hyperlipidemia).  Smoke.  Have diabetes.  Have heart disease or artery disease, such as carotid artery disease or peripheral artery disease.  Are age 39 or older.  Have a personal or family history of stroke. What are the signs or symptoms? Symptoms of this condition usually develop suddenly. They may include:  Weakness or numbness in your face, arm, or leg, especially on one side of your body.  Trouble walking or moving your arms or legs.  Loss of balance or coordination.  Slurred speech.  Vision problems.  Dizziness.  Nausea and vomiting.  Severe headache. How is this diagnosed? This condition may be diagnosed based on:  Your symptoms and medical history.  A physical exam.  Blood tests.  A CT scan or MRI of the brain. How is this treated? This condition must be treated within 3-4 hours of the start of the stroke. The goal is to restore blood flow to the brain as soon as possible. Treatments may include:  Medicine given through an IV to dissolve the  blood clot.  Blood thinners (antiplatelets).  Medicines to control blood pressure. Your health care provider may prescribe blood thinners to lower your risk of another stroke. Follow these instructions at home: Medicines  Take over-the-counter and prescription medicines only as told by your health care provider.  If you were told to take a medicine to thin your blood, such as aspirin, take it exactly as told by your health care provider. ? Taking too much blood-thinning medicine can cause bleeding. Taking too little may not protect you against a stroke and other problems. Eating and drinking  Follow instructions from your health care provider about diet.  Eat healthy foods. This includes plenty of fruits and vegetables, lean meats, whole grains, and low-fat dairy products.  Avoid foods high in saturated fat, trans fat, or sodium.  If you drink alcohol: ? Limit how much you have to:  0-1 drink a day for women.  0-2 drinks a day for men. ? Know how much alcohol is in your drink. In the [place] one drink equals 12 oz of beer, 5 oz of wine, or 1 oz of hard liquor. Safety  If you need help walking, use a cane or walker as told by your health care provider.  Take steps to lower the risk of falls in your home. This may include: ? Using raised toilets and a seat in the shower. ? Removing clutter and tripping hazards, such as cords or area rugs. ? Installing grab bars in the bedroom and bathroom. Activity  Exercise regularly, as told by your health care provider.  Take part in rehabilitation programs as told by your health care provider. This may include physical therapy, occupational therapy, or speech therapy. General instructions  Do not use any products that contain nicotine or tobacco. These products include cigarettes, chewing tobacco, and vaping devices, such as e-cigarettes. If you need help quitting, ask your health care provider.  Keep all follow-up visits. This is  important. Get help right away if:  You have any symptoms of stroke. "BE FAST" is an easy way to remember the main warning signs of stroke: ? B - Balance. Signs are dizziness, sudden trouble walking, or loss of balance. ? E - Eyes. Signs are trouble seeing or a sudden change in vision. ? F - Face. Signs are sudden weakness or numbness of the face, or the face or eyelid drooping on one side. ? A - Arms. Signs are weakness or numbness in an arm. This happens suddenly and usually on one side of the body. ? S - Speech. Signs are sudden trouble speaking, slurred speech, or trouble understanding what people say. ? T - Time. Time to call emergency services. Write down what time symptoms started.  You have other signs of stroke, such as: ? A sudden, severe headache. ? Nausea or vomiting. ? Seizure.  You have a severe fall or injury. These symptoms may represent a serious problem that is an emergency. Do not wait to see if the symptoms will go away. Get medical help right away. Call your local emergency services (911 in the [place] Do not drive yourself to the hospital.   Summary  A lacunar stroke (lacunar infarction) happens when an area of the brain does not get enough oxygen. This can lead to permanent brain damage.  This condition is a medical emergency that must be treated right away. Treatments must be done within 3-4 hours of the start of the stroke.  Controlling your risk factors for stroke is the best way to avoid another lacunar stroke.  Get help right away if you have any symptoms of stroke. "BE FAST" is an easy way to remember the main warning signs of stroke. This information is not intended to replace advice given to you by your health care provider. Make sure you discuss any questions you have with your health care provider. Document Revised: 12/17/2019 Document Reviewed: 12/17/2019 Elsevier Patient Education  2021 [employer].   Temporal Arteritis  Temporal arteritis is a  condition that causes arteries to become inflamed. It usually affects arteries in your head and face, but arteries in any part of the body can become inflamed. The condition is also called giant cell arteritis.  Temporal arteritis can cause serious problems such as blindness. Early treatment can help prevent these problems. What are the causes? The cause of this condition is not known. What increases the risk? The following factors may make you more likely to develop this condition:  Being older than 50.  Being a woman.  Being Caucasian.  Being of [place], [place], [place], [place] or [place] ancestry.  Having a family history of the condition.  Having a certain condition that causes muscle pain and stiffness (polymyalgia rheumatica, PMR). What are the signs or symptoms? Some people with temporal arteritis have just one symptom, while others have several symptoms. Most symptoms are related to the head and face. These may include:  Headache.  Hard or swollen temples. This is common. Your temples are the areas on either side  of your forehead. If your temples are swollen, it may hurt to touch them.  Pain when combing your hair or when laying your head down.  Pain in the jaw when chewing.  Pain in the throat or tongue.  Problems with your vision, such as sudden loss of vision in one eye, or seeing double. Other symptoms may include:  Fever.  Tiredness (fatigue).  A dry cough.  Pain in the hips and shoulders.  Pain in the arms during exercise.  Depression.  Weight loss. How is this diagnosed? This condition may be diagnosed based on:  Your symptoms.  Your medical history.  A physical exam.  Tests, including: ? Blood tests. ? A test in which a tissue sample is removed from an artery so it can be examined (biopsy). ? Imaging tests, such as an ultrasound or MRI. How is this treated? This condition may be treated with:  A type of medicine to reduce  inflammation (corticosteroid).  Medicines to weaken your immune system (immunosuppressants).  Other medicines to treat vision problems. You will need to see your health care provider while you are being treated. The medicines used to treat this condition can increase your risk of problems such as bone loss and diabetes. During follow-up visits, your health care provider will check for problems by:  Doing blood tests and bone density tests.  Checking your blood pressure and blood sugar. Follow these instructions at home: Medicines  Take over-the-counter and prescription medicines only as told by your health care provider.  Take any vitamins or supplements recommended by your health care provider. These may include vitamin D and calcium, which help keep your bones from becoming weak. Eating and drinking  Eat a heart-healthy diet. This may include: ? Eating high-fiber foods, such as fresh fruits and vegetables, whole grains, and beans. ? Eating heart-healthy fats (omega-3 fats), such as fish, flaxseed, and flaxseed oil. ? Limiting foods that are high in saturated fat and cholesterol, such as processed and fried foods, fatty meat, and full-fat dairy. ? Limiting how much salt (sodium) you eat.  Include calcium and vitamin D in your diet. Good sources of calcium and vitamin D include: ? Low-fat dairy products such as milk, yogurt, and cheese. ? Certain fish, such as fresh or canned salmon, tuna, and sardines. ? Products that have calcium and vitamin D added to them (fortified products), such as fortified cereals or juice.   General instructions  Exercise. Talk with your health care provider about what exercises are okay for you. Exercises that increase your heart rate (aerobic exercise), such as walking, are often recommended. Aerobic exercise helps control your blood pressure and prevent bone loss.  Stay up to date on all vaccines as directed by your health care provider.  Keep all  follow-up visits as told by your health care provider. This is important. Contact a health care provider if:  Your symptoms get worse.  You develop signs of infection, such as fever, swelling, redness, warmth, and tenderness. Get help right away if:  You lose your vision.  Your pain does not go away, even after you take medicine.  You have chest pain.  You have trouble breathing.  One side of your face or body suddenly becomes weak or numb. These symptoms may represent a serious problem that is an emergency. Do not wait to see if the symptoms will go away. Get medical help right away. Call your local emergency services (911 in the [place] Do not drive yourself to  the hospital. Summary  Temporal arteritis is a condition that causes arteries to become inflamed. It usually affects arteries in your head and face.  This condition can cause serious problems, such as blindness. Treatment can help prevent these problems.  Symptoms may include hard or tender temples, pain in your jaw when chewing, problems with your vision, or pain in your hips and shoulders.  Take over-the-counter and prescription medicines as told by your health care provider. This information is not intended to replace advice given to you by your health care provider. Make sure you discuss any questions you have with your health care provider. Document Revised: 07/11/2017 Document Reviewed: 07/09/2017 Elsevier Patient Education  2021 [employer].

## 2020-07-28 LAB — COMPREHENSIVE METABOLIC PANEL
ALT: 16 IU/L (ref 0–32)
AST: 18 IU/L (ref 0–40)
Albumin/Globulin Ratio: 1.9 (ref 1.2–2.2)
Albumin: 4.6 g/dL (ref 3.7–4.7)
Alkaline Phosphatase: 53 IU/L (ref 44–121)
BUN/Creatinine Ratio: 19 (ref 12–28)
BUN: 15 mg/dL (ref 8–27)
Bilirubin Total: 0.4 mg/dL (ref 0.0–1.2)
CO2: 22 mmol/L (ref 20–29)
Calcium: 9.9 mg/dL (ref 8.7–10.3)
Chloride: 103 mmol/L (ref 96–106)
Creatinine, Ser: 0.8 mg/dL (ref 0.57–1.00)
GFR calc Af Amer: 86 mL/min/{1.73_m2} (ref >59)
GFR calc non Af Amer: 74 mL/min/{1.73_m2} (ref >59)
Globulin, Total: 2.4 g/dL (ref 1.5–4.5)
Glucose: 103 mg/dL — ABNORMAL HIGH (ref 65–99)
Potassium: 4.5 mmol/L (ref 3.5–5.2)
Sodium: 140 mmol/L (ref 134–144)
Total Protein: 7 g/dL (ref 6.0–8.5)

## 2020-07-28 LAB — CBC WITH DIFFERENTIAL/PLATELET
Basophils Absolute: 0.1 10*3/uL (ref 0.0–0.2)
Basos: 1 %
EOS (ABSOLUTE): 0.1 10*3/uL (ref 0.0–0.4)
Eos: 1 %
Hematocrit: 41 % (ref 34.0–46.6)
Hemoglobin: 13.7 g/dL (ref 11.1–15.9)
Immature Grans (Abs): 0 10*3/uL (ref 0.0–0.1)
Immature Granulocytes: 0 %
Lymphocytes Absolute: 2.1 10*3/uL (ref 0.7–3.1)
Lymphs: 37 %
MCH: 32 pg (ref 26.6–33.0)
MCHC: 33.4 g/dL (ref 31.5–35.7)
MCV: 96 fL (ref 79–97)
Monocytes Absolute: 0.5 10*3/uL (ref 0.1–0.9)
Monocytes: 8 %
Neutrophils Absolute: 3 10*3/uL (ref 1.4–7.0)
Neutrophils: 53 %
Platelets: 317 10*3/uL (ref 150–450)
RBC: 4.28 x10E6/uL (ref 3.77–5.28)
RDW: 12.6 % (ref 11.7–15.4)
WBC: 5.7 10*3/uL (ref 3.4–10.8)

## 2020-07-28 LAB — C-REACTIVE PROTEIN: CRP: <1 mg/L (ref 0–10)

## 2020-07-28 LAB — TSH: TSH: 3.26 u[IU]/mL (ref 0.450–4.500)

## 2020-07-28 LAB — SEDIMENTATION RATE: Sed Rate: 2 mm/hr (ref 0–40)

## 2020-07-31 ENCOUNTER — Other Ambulatory Visit: Payer: Self-pay

## 2020-07-31 ENCOUNTER — Ambulatory Visit
Admission: RE | Admit: 2020-07-31 | Discharge: 2020-07-31 | Disposition: A | Discharge status: Home / Self Care | Payer: Medicare Other | Source: Ambulatory Visit | Attending: Neurology | Admitting: Neurology

## 2020-07-31 DIAGNOSIS — M4802 Spinal stenosis, cervical region: Secondary | ICD-10-CM

## 2020-07-31 DIAGNOSIS — R270 Ataxia, unspecified: Secondary | ICD-10-CM

## 2020-07-31 DIAGNOSIS — R519 Headache, unspecified: Secondary | ICD-10-CM

## 2020-07-31 DIAGNOSIS — G8929 Other chronic pain: Secondary | ICD-10-CM

## 2020-07-31 DIAGNOSIS — R269 Unspecified abnormalities of gait and mobility: Secondary | ICD-10-CM

## 2020-07-31 DIAGNOSIS — M542 Cervicalgia: Secondary | ICD-10-CM

## 2020-07-31 DIAGNOSIS — R2689 Other abnormalities of gait and mobility: Secondary | ICD-10-CM

## 2020-08-10 ENCOUNTER — Other Ambulatory Visit: Payer: Self-pay | Admitting: Obstetrics & Gynecology

## 2020-08-10 DIAGNOSIS — Z1231 Encounter for screening mammogram for malignant neoplasm of breast: Secondary | ICD-10-CM

## 2020-08-11 ENCOUNTER — Ambulatory Visit
Admission: RE | Admit: 2020-08-11 | Discharge: 2020-08-11 | Disposition: A | Discharge status: Home / Self Care | Payer: Medicare Other | Source: Ambulatory Visit | Attending: Obstetrics & Gynecology | Admitting: Obstetrics & Gynecology

## 2020-08-11 ENCOUNTER — Other Ambulatory Visit: Payer: Self-pay

## 2020-08-11 DIAGNOSIS — Z1231 Encounter for screening mammogram for malignant neoplasm of breast: Secondary | ICD-10-CM

## 2020-08-29 ENCOUNTER — Ambulatory Visit (INDEPENDENT_AMBULATORY_CARE_PROVIDER_SITE_OTHER): Payer: Medicare Other | Admitting: Neurology

## 2020-08-29 ENCOUNTER — Other Ambulatory Visit: Payer: Self-pay

## 2020-08-29 ENCOUNTER — Encounter: Payer: Self-pay | Admitting: Neurology

## 2020-08-29 VITALS — BP 124/78 | HR 82 | Ht 67.5 in | Wt 137.0 lb

## 2020-08-29 DIAGNOSIS — G4720 Circadian rhythm sleep disorder, unspecified type: Secondary | ICD-10-CM | POA: Diagnosis not present

## 2020-08-29 DIAGNOSIS — R519 Headache, unspecified: Secondary | ICD-10-CM | POA: Insufficient documentation

## 2020-08-29 DIAGNOSIS — R0689 Other abnormalities of breathing: Secondary | ICD-10-CM | POA: Diagnosis not present

## 2020-08-29 DIAGNOSIS — G2581 Restless legs syndrome: Secondary | ICD-10-CM | POA: Diagnosis not present

## 2020-08-29 DIAGNOSIS — F5104 Psychophysiologic insomnia: Secondary | ICD-10-CM | POA: Insufficient documentation

## 2020-08-29 MED ORDER — TRAZODONE HCL 50 MG PO TABS: 25.0000 mg | ORAL_TABLET | Freq: Every day | ORAL | 1 refills | Status: DC

## 2020-08-29 NOTE — Progress Notes (Signed)
SLEEP MEDICINE CLINIC    Provider:  Larey Seat, MD  Primary Care Physician:  London Pepper, MD [address] [place] [address]     Referring Provider:         Chief Complaint according to patient   Patient presents with:    . New Patient (Initial Visit)     Pt alone, rm 11. Presents today to assess if OSA could be a concern. Never had a SS. She has difficulty with sleeping through the night. Has to take xanax nightly to sleep. Avg 6/7 hrs of sleep but will wake up. 1-2 times a mth she has woke up starting herself, trying to catch breath and heart rate speeds up. Mouth breather.       HISTORY OF PRESENT ILLNESS:  Molly Ortiz is a 70 y.o. Caucasian female patient and was seen upon Dr. Cathren Laine  Request for a sleep consultation on 08/29/2020 f Chief concern according to patient :  Pt alone, rm 11. Presents today to assess if OSA could be a concern. Never had a SS. She has difficulty with sleeping through the night. Has to take xanax nightly to sleep. Average 6/7 hours of sleep but will wake up. 1-2 times a month she has woken up startling herself, trying to catch her breath and heart rate speeds up. Mouth breather.    Molly Ortiz  has a past medical history of Adenomatous polyps, Depression, Insomnia, Osteopenia, Plantar fasciitis, and Vitamin D deficiency.     Sleep relevant medical history: Xanax habit, no ENT surgery. Had low back surgery-   Family medical /sleep history: No other family member on CPAP with OSA, insomnia, sleep walkers.    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 The patient currently works per engagement- contract , [place] of state- irregular.  Tobacco use: never ETOH use : 4 /week, Caffeine intake in form of Coffee( 3 cups of coffee-) . Regular exercise in form of walking. .      Sleep habits are as follows: The patient's dinner time is  between 6-7 PM.  The patient goes to bed at 11-12 PM and continues to sleep for 6-7 hours, no bathroom breaks.   The preferred sleep position is left sided, with the support of 1-2 pillows.  Dreams are reportedly frequent.  6 AM is the usual rise time.  The patient wakes up spontaneously.  She reports not feeling refreshed or restored in AM if she slept without xanax its only 3-4 AM, with symptoms such as dry mouth , morning headaches, and residual fatigue.  Naps are taken infrequently, lasting from 1-2 hours and are less refreshing than nocturnal sleep.    Review of Systems: Out of a complete 14 system review, the patient complains of only the following symptoms, and all other reviewed systems are negative.:  Fatigue, sleepiness , dry mouth, fragmented sleep, Insomnia - on Xanax, racing thought.    How likely are you to doze in the following situations: 0 = not likely, 1 = slight chance, 2 = moderate chance, 3 = high chance   Sitting and Reading? Watching Television? Sitting inactive in a public place (theater or meeting)? As a passenger in a car for an hour without a break? Lying down in the afternoon when circumstances permit? Sitting and talking to someone? Sitting quietly after lunch without alcohol? In a car, while stopped for  a few minutes in traffic?   Total = 11/ 24 points   FSS endorsed at 17/ 63 points. UNREFRESHED  Social History   Socioeconomic History  . Marital status: Single    Spouse name: Not on file  . Number of children: Not on file  . Years of education: Not on file  . Highest education level: Bachelor's degree (e.g., BA, AB, BS)  Occupational History  . Not on file  Tobacco Use  . Smoking status: Never Smoker  . Smokeless tobacco: Never Used  Vaping Use  . Vaping Use: Never used  Substance and Sexual Activity  . Alcohol use: Yes    Alcohol/week: 4.0 standard drinks    Types: 4 Standard drinks or equivalent per week    Comment: was approx 16/week   . Drug use: Never  . Sexual activity: Yes    Partners: Male    Birth control/protection: Post-menopausal    Comment: first sexual encounter at age 54 yrs old . only 5 partners in a lifetime  Other Topics Concern  . Not on file  Social History Narrative   Caffeine: coffee 2 cups per day   Right handed   Lives alone   Social Determinants of Health   Financial Resource Strain: Not on file  Food Insecurity: Not on file  Transportation Needs: Not on file  Physical Activity: Not on file  Stress: Not on file  Social Connections: Not on file    Family History  Problem Relation Age of Onset  . Cancer Mother        CERVICAL OR UTERINE  . Diabetes Father   . Hypertension Father   . Cancer Father        LUNG  . Migraines Niece   . Breast cancer Neg Hx     Past Medical History:  Diagnosis Date  . Adenomatous polyps    2 dap's 07/2011 [place], [place]), w/ mediocre prep -per notes from [facility]  . Depression   . Insomnia   . Osteopenia   . Plantar fasciitis   . Vitamin D deficiency     Past Surgical History:  Procedure Laterality Date  . COLONOSCOPY    . DILATION AND CURETTAGE OF UTERUS     MISSED AB  . LUMBAR FUSION  11/2016  . LUMBAR FUSION  02/2015     Current Outpatient Medications on File Prior to Visit  Medication Sig Dispense Refill  . ALPRAZolam (XANAX) 0.5 MG tablet Take 0.5 mg by mouth at bedtime as needed for anxiety.    Marland Kitchen aspirin EC 81 MG tablet Take 1 tablet (81 mg total) by mouth daily. Swallow whole. 30 tablet 11  . buPROPion (WELLBUTRIN XL) 300 MG 24 hr tablet Take by mouth.    . Cholecalciferol (VITAMIN D) 50 MCG (2000 UT) CAPS 1 capsule    . gabapentin (NEURONTIN) 100 MG capsule Take 100 mg by mouth daily.    . Multiple Vitamin (MULTIVITAMIN PO) Take by mouth.     No current facility-administered medications on file prior to visit.  Physical exam:  Today's Vitals   08/29/20 1100  BP: 124/78  Pulse: 82  Weight: 137 lb (62.1 kg)   Height: 5' 7.5" (1.715 m)   Body mass index is 21.14 kg/m.   Wt Readings from Last 3 Encounters:  08/29/20 137 lb (62.1 kg)  07/27/20 140 lb (63.5 kg)  09/04/19 137 lb (62.1 kg)     Ht Readings from Last 3 Encounters:  08/29/20 5' 7.5" (  1.715 m)  07/27/20 5' 7.5" (1.715 m)  09/04/19 5' 6.75" (1.695 m)      General: The patient is awake, alert and appears not in acute distress. The patient is well groomed. Head: Normocephalic, atraumatic. Neck is supple.  Mallampati: 2 ,  neck circumference:13 inches . Nasal airflow patent.  Retrognathia is seen.  Braces, invisaline-  Dental status: intact , Crowded.  Cardiovascular:  Regular rate and cardiac rhythm by pulse,  without distended neck veins. Respiratory: Lungs are clear to auscultation.  Skin:  Without evidence of ankle edema, or rash. Trunk: The patient's posture is erect.   Neurologic exam : The patient is awake and alert, oriented to place and time.   Memory subjective described as intact.  Attention span & concentration ability appears normal.  Speech is fluent,  without  dysarthria, dysphonia or aphasia.  Mood and affect are slightly anxious.   Cranial nerves: no loss of smell or taste reported  Pupils are equal and briskly reactive to light. Funduscopic exam deferred.   Extraocular movements in vertical and horizontal planes were intact and without nystagmus. No Diplopia. Visual fields by finger perimetry are intact. Hearing was intact to soft voice and finger rubbing.    Facial sensation intact to fine touch.  Facial motor strength is symmetric and tongue and uvula move midline.  Neck ROM : rotation, tilt and flexion extension were normal for age and shoulder shrug was symmetrical.    Motor exam:  Symmetric bulk, tone and ROM.   Normal tone without cog wheeling, symmetric grip strength .   Sensory:  Fine touch and vibration were normal.  Proprioception tested in the upper extremities was normal.   Coordination:  Rapid alternating movements in the fingers/hands were of normal speed.  The Finger-to-nose maneuver was intact without evidence of ataxia, dysmetria or tremor.   Gait and station: Patient could rise unassisted from a seated position, walked without assistive device.  Stance is of normal width/ base..  Toe and heel walk were deferred.  Deep tendon reflexes: in the upper and lower extremities are symmetric and intact. Patella only trace.  Babinski response was deferred .       After spending a total time of 45  minutes face to face and additional time for physical and neurologic examination, review of laboratory studies,  personal review of imaging studies, reports and results of other testing and review of referral information / records as far as provided in visit, I have established the following assessments:  1)  Chronic insomnia - failed melatonin, now uses Xanax- racing thoughts.   2)  New onset headaches after age 60. Migraine and other.   3)  Crowded teeth and small oral cavity,   4) Restless legs , cramping- charley horses-  the irresistible urge to move.    My Plan is to proceed with:  1) HST ordered to look for OSA.  2) Trazodone - start 25 mg- helps dry mouth, helps sleep does not impair memory.   3) sleep hygiene- set rise time and bed time will time. No screen light at night, no TV and no smart phone, not even a alarm clock. Read in a book and with incandescent light bull. No LED.    Dear Sarina Ill, MD  In short, Molly Ortiz is presenting with non restorative sleep, insomnia,  I plan to follow up either personally or through our NP within 3 month.   CC: I will share my notes with PCP  Electronically signed by: Larey Seat, MD 08/29/2020 11:23 AM  [facility] Board certified by The [employer] of Sleep Medicine and Diplomate of the [employer] of Sleep Medicine. Board certified In Neurology through the [place],  Fellow of the [employer] of Neurology. Medical Director of [facility].

## 2020-08-29 NOTE — Addendum Note (Signed)
Addended by: Larey Seat on: 08/29/2020 12:13 PM   Modules accepted: Orders

## 2020-08-29 NOTE — Patient Instructions (Signed)

## 2020-09-05 ENCOUNTER — Encounter: Payer: Self-pay | Admitting: Obstetrics & Gynecology

## 2020-09-05 ENCOUNTER — Other Ambulatory Visit: Payer: Self-pay

## 2020-09-05 ENCOUNTER — Ambulatory Visit (INDEPENDENT_AMBULATORY_CARE_PROVIDER_SITE_OTHER): Payer: Medicare Other | Admitting: Obstetrics & Gynecology

## 2020-09-05 VITALS — BP 120/78 | Ht 66.25 in | Wt 135.0 lb

## 2020-09-05 DIAGNOSIS — Z9189 Other specified personal risk factors, not elsewhere classified: Secondary | ICD-10-CM | POA: Diagnosis not present

## 2020-09-05 DIAGNOSIS — Z780 Asymptomatic menopausal state: Secondary | ICD-10-CM

## 2020-09-05 DIAGNOSIS — Z1272 Encounter for screening for malignant neoplasm of vagina: Secondary | ICD-10-CM | POA: Diagnosis not present

## 2020-09-05 DIAGNOSIS — M8589 Other specified disorders of bone density and structure, multiple sites: Secondary | ICD-10-CM

## 2020-09-05 DIAGNOSIS — Z01419 Encounter for gynecological examination (general) (routine) without abnormal findings: Secondary | ICD-10-CM | POA: Diagnosis not present

## 2020-09-05 NOTE — Progress Notes (Signed)
Molly Ortiz 06-30-1950 [id number]   History:    70 y.o. G7P0A7 Longstanding boyfriend.  ZW:[id number] presenting for annual gyn exam   RXV:[id number], well on no hormone replacement therapy. No postmenopausal bleeding. No pelvic pain. Not sexually active x 2 years.  Boyfriend's conspirational theories affecting the relationship. Urine and bowel movements normal. Breasts normal. Body mass index 21.63. Good fitness and healthy nutrition. Health labs with family physician. Colono 09/2019.  Past medical history,surgical history, family history and social history were all reviewed and documented in the EPIC chart.  Gynecologic History No LMP recorded. Patient is postmenopausal.  Obstetric History OB History  Gravida Para Term Preterm AB Living  7       7 0  SAB IAB Ectopic Multiple Live Births  6 1          # Outcome Date GA Lbr Len/2nd Weight Sex Delivery Anes PTL Lv  7 IAB           6 SAB           5 SAB           4 SAB           3 SAB           2 SAB           1 SAB              ROS: A ROS was performed and pertinent positives and negatives are included in the history.  GENERAL: No fevers or chills. HEENT: No change in vision, no earache, sore throat or sinus congestion. NECK: No pain or stiffness. CARDIOVASCULAR: No chest pain or pressure. No palpitations. PULMONARY: No shortness of breath, cough or wheeze. GASTROINTESTINAL: No abdominal pain, nausea, vomiting or diarrhea, melena or bright red blood per rectum. GENITOURINARY: No urinary frequency, urgency, hesitancy or dysuria. MUSCULOSKELETAL: No joint or muscle pain, no back pain, no recent trauma. DERMATOLOGIC: No rash, no itching, no lesions. ENDOCRINE: No polyuria, polydipsia, no heat or cold intolerance. No recent change in weight. HEMATOLOGICAL: No anemia or easy bruising or bleeding. NEUROLOGIC: No headache, seizures, numbness, tingling or weakness. PSYCHIATRIC: No depression, no loss of interest  in normal activity or change in sleep pattern.     Exam:   BP 120/78   Ht 5' 6.25" (1.683 m)   Wt 135 lb (61.2 kg)   BMI 21.63 kg/m   Body mass index is 21.63 kg/m.  General appearance : Well developed well nourished female. No acute distress HEENT: Eyes: no retinal hemorrhage or exudates,  Neck supple, trachea midline, no carotid bruits, no thyroidmegaly Lungs: Clear to auscultation, no rhonchi or wheezes, or rib retractions  Heart: Regular rate and rhythm, no murmurs or gallops Breast:Examined in sitting and supine position were symmetrical in appearance, no palpable masses or tenderness,  no skin retraction, no nipple inversion, no nipple discharge, no skin discoloration, no axillary or supraclavicular lymphadenopathy Abdomen: no palpable masses or tenderness, no rebound or guarding Extremities: no edema or skin discoloration or tenderness  Pelvic: Vulva: Normal             Vagina: No gross lesions or discharge  Cervix: No gross lesions or discharge.  Pap reflex done.  Uterus  AV, normal size, shape and consistency, non-tender and mobile  Adnexa  Without masses or tenderness  Anus: Normal   Assessment/Plan:  70 y.o. female for annual exam   1. Encounter for routine gynecological examination with Papanicolaou smear of  cervix Normal gynecologic exam.  Pap reflex done.  Breast exam normal.  Screening mammo negative 08/2020.  Colono 09/2019.  Health labs with Fam MD.  BMI 21.63.  Fit and healthy nutrition.  2. Postmenopause Well on no HRT.  No PMB.  3. Osteopenia of multiple sites Osteopenia on BD 08/2018 T-Score -2.2 at Rt femoral neck.  Schedule BD here now.  Vit D supplements, Ca++ 1.5 g/d, regular weight bearing physical activities. - DG Bone Density; Future  Princess Bruins MD, 10:34 AM 09/05/2020

## 2020-09-06 ENCOUNTER — Telehealth: Payer: Self-pay

## 2020-09-06 NOTE — Addendum Note (Signed)
Addended by: Thurnell Garbe A on: 09/06/2020 09:35 AM   Modules accepted: Orders

## 2020-09-06 NOTE — Telephone Encounter (Signed)
LVM for pt to call me back to schedule sleep study  

## 2020-09-08 LAB — PAP IG W/ RFLX HPV ASCU

## 2020-09-19 ENCOUNTER — Telehealth: Payer: Self-pay

## 2020-09-19 NOTE — Telephone Encounter (Signed)
We have attempted to call the patient two times to schedule sleep study.  Patient has been unavailable at the phone numbers we have on file and has not returned our calls. If patient calls back we will schedule them for their sleep study.  

## 2020-09-29 ENCOUNTER — Other Ambulatory Visit: Payer: Self-pay

## 2020-09-29 ENCOUNTER — Encounter: Payer: Self-pay | Admitting: Nurse Practitioner

## 2020-09-29 ENCOUNTER — Ambulatory Visit (INDEPENDENT_AMBULATORY_CARE_PROVIDER_SITE_OTHER): Payer: Medicare Other | Admitting: Nurse Practitioner

## 2020-09-29 VITALS — BP 120/74

## 2020-09-29 DIAGNOSIS — N3001 Acute cystitis with hematuria: Secondary | ICD-10-CM | POA: Diagnosis not present

## 2020-09-29 DIAGNOSIS — N898 Other specified noninflammatory disorders of vagina: Secondary | ICD-10-CM

## 2020-09-29 DIAGNOSIS — R300 Dysuria: Secondary | ICD-10-CM | POA: Diagnosis not present

## 2020-09-29 LAB — WET PREP FOR TRICH, YEAST, CLUE

## 2020-09-29 MED ORDER — SULFAMETHOXAZOLE-TRIMETHOPRIM 800-160 MG PO TABS: 1.0000 | ORAL_TABLET | Freq: Two times a day (BID) | ORAL | 0 refills | Status: AC

## 2020-09-29 NOTE — Progress Notes (Signed)
   Acute Office Visit  Subjective:    Patient ID: Molly Ortiz, female    DOB: [birth year], 70 y.o.   MRN: [id number]   HPI 70 y.o. presents today for burning with urination, frequency, and vulvar itching. She is sexually active infrequently but had intercourse 5 days ago and symptoms began shortly after.   Review of Systems  Constitutional: Negative.   Genitourinary: Positive for dysuria and frequency. Negative for hematuria and vaginal discharge.       Vulvar itching       Objective:    Physical Exam Constitutional:      Appearance: Normal appearance.  Abdominal:     Tenderness: There is no right CVA tenderness or left CVA tenderness.  Genitourinary:    General: Normal vulva.     Vagina: Normal.     Cervix: Normal.     Uterus: Normal.      BP 120/74  Wt Readings from Last 3 Encounters:  09/05/20 135 lb (61.2 kg)  08/29/20 137 lb (62.1 kg)  07/27/20 140 lb (63.5 kg)   UA 3+ leukocytes, wbc >60, nitrate negative, 2+ blood, rbc 20-40, many bacteria Wet prep negative     Assessment & Plan:   Problem List Items Addressed This Visit   None   Visit Diagnoses    Acute cystitis with hematuria    -  Primary   Relevant Medications   sulfamethoxazole-trimethoprim (BACTRIM DS) 800-160 MG tablet   Vagina itching       Relevant Orders   WET PREP FOR TRICH, YEAST, CLUE   Dysuria       Relevant Orders   Urinalysis,Complete w/RFL Culture     Plan: Urinalysis and symptoms consistent with UTI - Bactrim 800-160 mg twice daily x 3 days. Urine culture pending, will triage based on results. AZO as needed for symptom management. She is agreeable to plan.      Tamela Gammon DNP, 3:18 PM 09/29/2020

## 2020-09-30 ENCOUNTER — Other Ambulatory Visit: Payer: Self-pay | Admitting: Neurology

## 2020-10-02 LAB — URINE CULTURE
MICRO NUMBER:: 11797736
SPECIMEN QUALITY:: ADEQUATE

## 2020-10-02 LAB — URINALYSIS, COMPLETE W/RFL CULTURE
Bilirubin Urine: NEGATIVE
Glucose, UA: NEGATIVE
Hyaline Cast: NONE SEEN /LPF
Ketones, ur: NEGATIVE
Nitrites, Initial: NEGATIVE
Protein, ur: NEGATIVE
Specific Gravity, Urine: 1.015 (ref 1.001–1.035)
WBC, UA: >=60 /HPF — AB (ref 0–5)
pH: 6 (ref 5.0–8.0)

## 2020-10-02 LAB — CULTURE INDICATED

## 2020-10-03 ENCOUNTER — Telehealth: Payer: Self-pay

## 2020-10-03 NOTE — Telephone Encounter (Signed)
Patient called to make sure that the Rx she had for Septra DS # 6 to take bid x 3 days would clear her UTI. She was concerned about having only 6 tabs. I reassured her that was  A treatment and it is a double strength medication.

## 2021-12-05 IMAGING — MG MM DIGITAL SCREENING BILAT W/ TOMO AND CAD
8 series · 9 of 24 positions shown · non-contrast
Comparison: Previous exam(s).

CLINICAL DATA: Screening.

EXAM:
DIGITAL SCREENING BILATERAL MAMMOGRAM WITH TOMOSYNTHESIS AND CAD
TECHNIQUE: Bilateral screening digital craniocaudal and mediolateral oblique
mammograms were obtained. Bilateral screening digital breast
tomosynthesis was performed. The images were evaluated with
computer-aided detection.

[R MLO synth-2D]
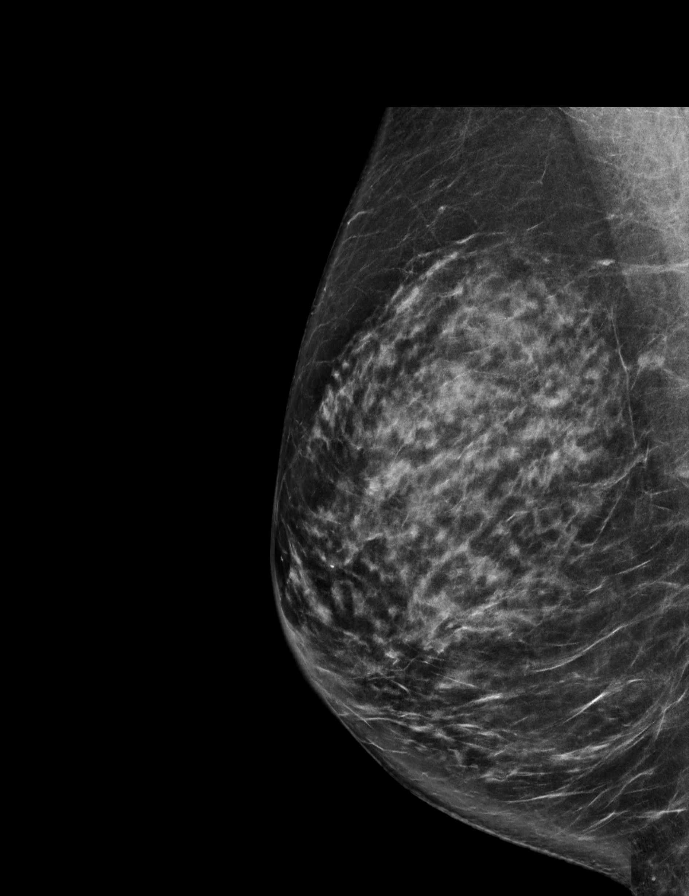

[R CC synth-2D]
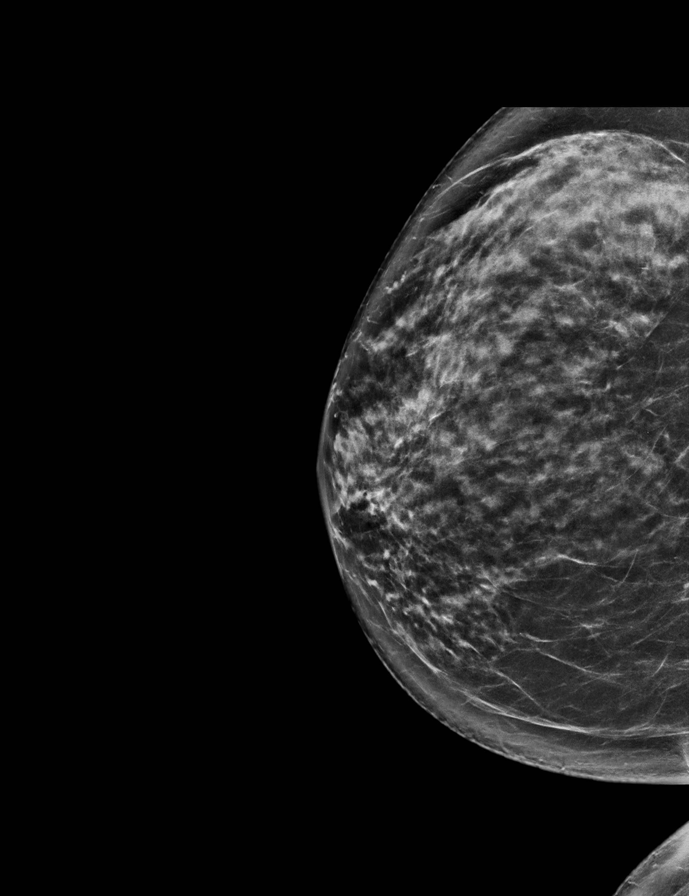

[L MLO synth-2D]
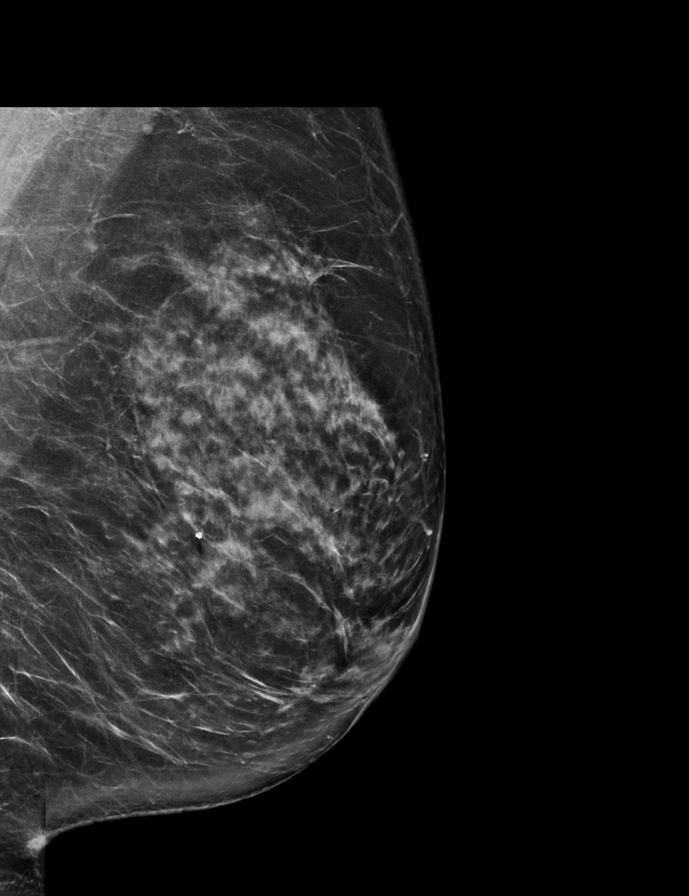

[L CC synth-2D]
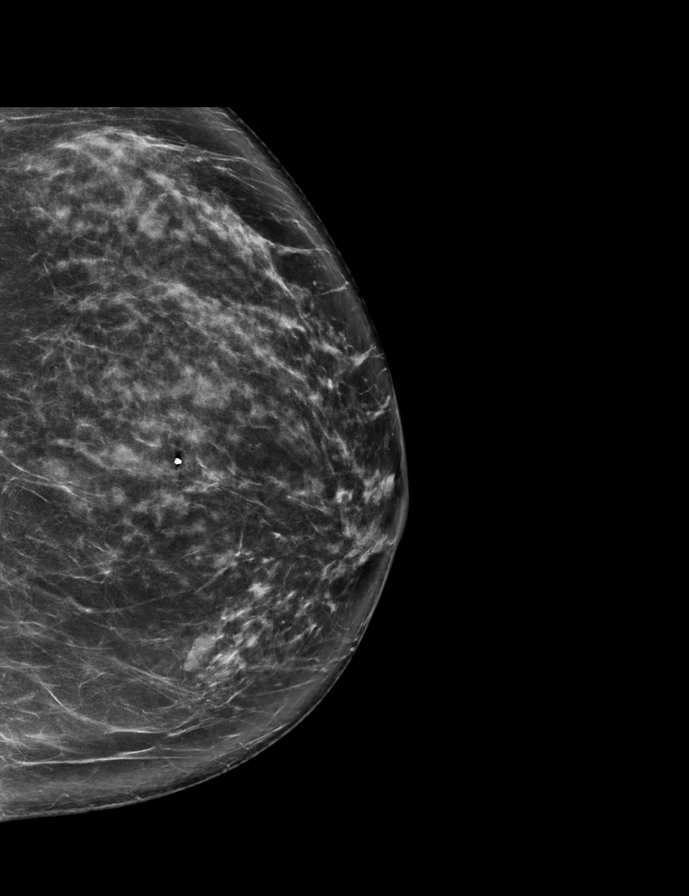

[R MLO tomo · 2 of 77 frames shown]
[frame 25/77]
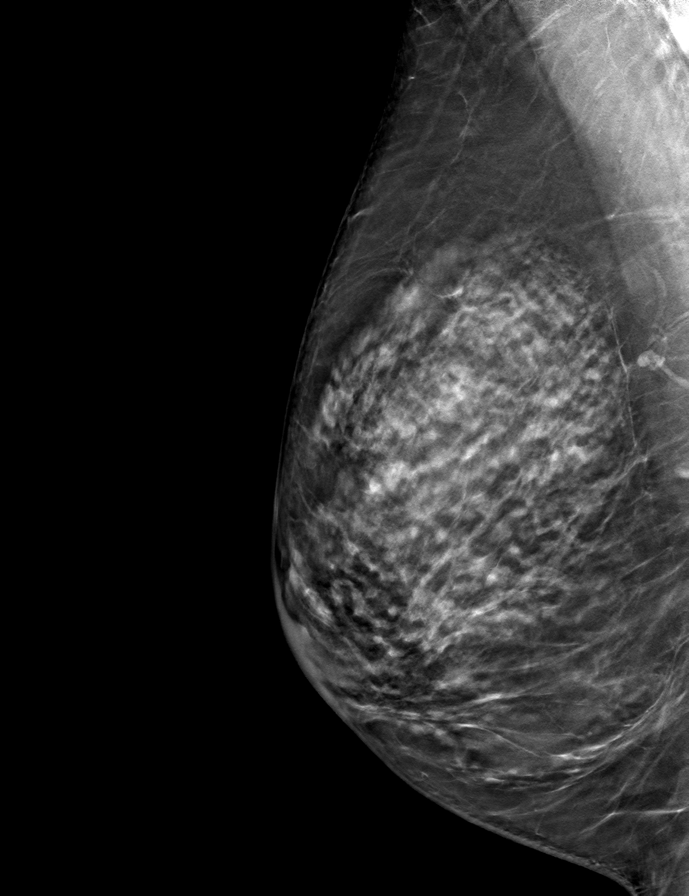
[frame 39/77]
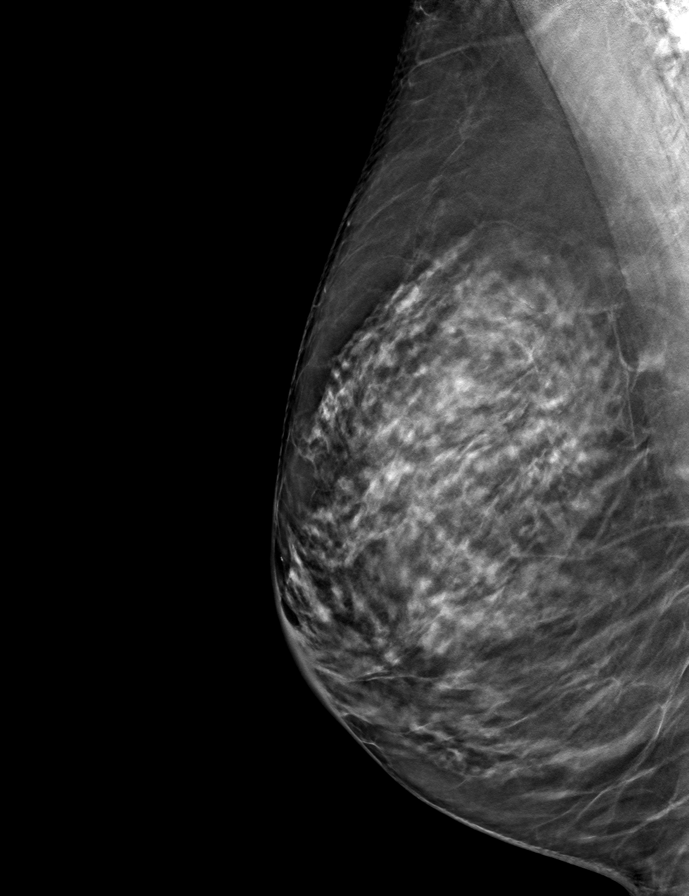

[L MLO tomo · tomo slice 42/83.0]
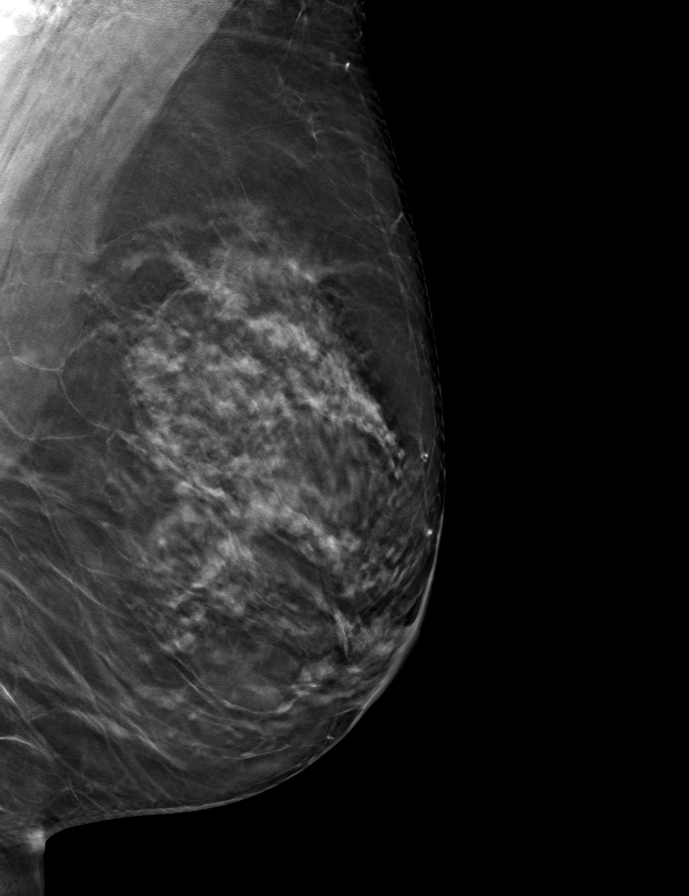

[L CC tomo · tomo slice 39/78.0]
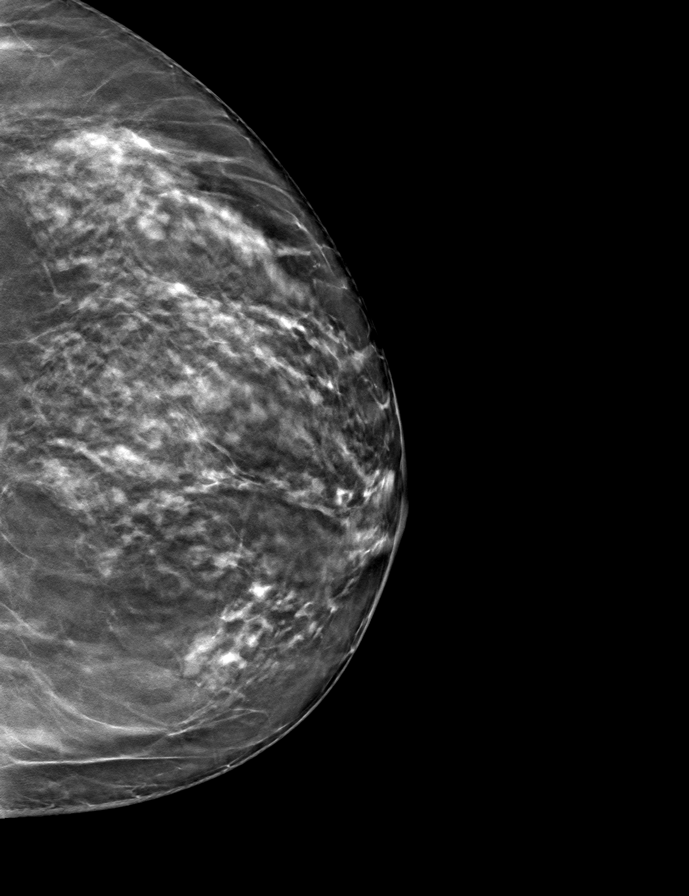

[R CC tomo · tomo slice 37/73.0]
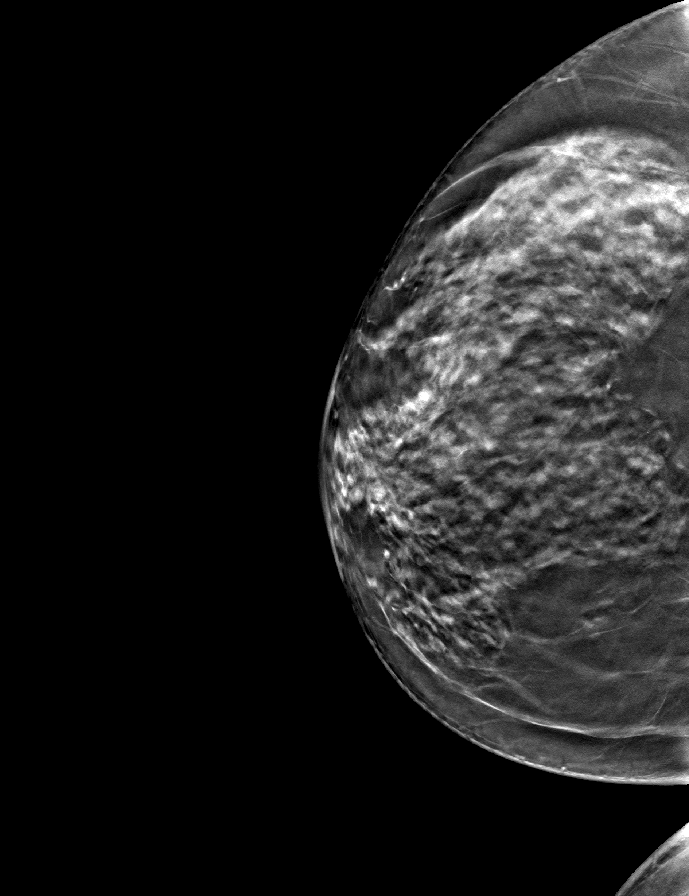

[9 of 24 positions shown; findings below may reference images not displayed]

ACR Breast Density Category c: The breast tissue is heterogeneously
dense, which may obscure small masses.
FINDINGS: There are no findings suspicious for malignancy. The images were
evaluated with computer-aided detection.
IMPRESSION: No mammographic evidence of malignancy. A result letter of this
screening mammogram will be mailed directly to the patient.

RECOMMENDATION:
Screening mammogram in one year. (Code:T4-5-GWO)

BI-RADS CATEGORY  1: Negative.

## 2022-03-12 ENCOUNTER — Other Ambulatory Visit (HOSPITAL_BASED_OUTPATIENT_CLINIC_OR_DEPARTMENT_OTHER): Payer: Self-pay | Admitting: Family Medicine

## 2022-03-12 ENCOUNTER — Ambulatory Visit (HOSPITAL_BASED_OUTPATIENT_CLINIC_OR_DEPARTMENT_OTHER)
Admission: RE | Admit: 2022-03-12 | Discharge: 2022-03-12 | Disposition: A | Discharge status: Home / Self Care | Payer: Medicare Other | Source: Ambulatory Visit | Attending: Family Medicine | Admitting: Family Medicine

## 2022-03-12 ENCOUNTER — Encounter (HOSPITAL_BASED_OUTPATIENT_CLINIC_OR_DEPARTMENT_OTHER): Payer: Self-pay

## 2022-03-12 DIAGNOSIS — R42 Dizziness and giddiness: Secondary | ICD-10-CM

## 2022-03-12 DIAGNOSIS — R2990 Unspecified symptoms and signs involving the nervous system: Secondary | ICD-10-CM

## 2022-03-13 ENCOUNTER — Other Ambulatory Visit: Payer: Self-pay | Admitting: Obstetrics & Gynecology

## 2022-03-13 DIAGNOSIS — Z1231 Encounter for screening mammogram for malignant neoplasm of breast: Secondary | ICD-10-CM

## 2022-03-14 ENCOUNTER — Ambulatory Visit
Admission: RE | Admit: 2022-03-14 | Discharge: 2022-03-14 | Disposition: A | Discharge status: Home / Self Care | Payer: Medicare Other | Source: Ambulatory Visit | Attending: Obstetrics & Gynecology | Admitting: Obstetrics & Gynecology

## 2022-03-14 DIAGNOSIS — Z1231 Encounter for screening mammogram for malignant neoplasm of breast: Secondary | ICD-10-CM

## 2022-03-28 ENCOUNTER — Ambulatory Visit (INDEPENDENT_AMBULATORY_CARE_PROVIDER_SITE_OTHER): Payer: Medicare Other | Admitting: Obstetrics & Gynecology

## 2022-03-28 ENCOUNTER — Telehealth: Payer: Self-pay

## 2022-03-28 ENCOUNTER — Encounter: Payer: Self-pay | Admitting: Obstetrics & Gynecology

## 2022-03-28 ENCOUNTER — Other Ambulatory Visit (HOSPITAL_COMMUNITY)
Admission: RE | Admit: 2022-03-28 | Discharge: 2022-03-28 | Disposition: A | Discharge status: Home / Self Care | Payer: Medicare Other | Source: Ambulatory Visit | Attending: Obstetrics & Gynecology | Admitting: Obstetrics & Gynecology

## 2022-03-28 VITALS — BP 114/70 | HR 81 | Ht 66.25 in | Wt 144.0 lb

## 2022-03-28 DIAGNOSIS — Z780 Asymptomatic menopausal state: Secondary | ICD-10-CM | POA: Diagnosis not present

## 2022-03-28 DIAGNOSIS — M542 Cervicalgia: Secondary | ICD-10-CM

## 2022-03-28 DIAGNOSIS — Z01419 Encounter for gynecological examination (general) (routine) without abnormal findings: Secondary | ICD-10-CM

## 2022-03-28 DIAGNOSIS — Z9189 Other specified personal risk factors, not elsewhere classified: Secondary | ICD-10-CM | POA: Diagnosis not present

## 2022-03-28 DIAGNOSIS — Z124 Encounter for screening for malignant neoplasm of cervix: Secondary | ICD-10-CM | POA: Diagnosis not present

## 2022-03-28 DIAGNOSIS — M8589 Other specified disorders of bone density and structure, multiple sites: Secondary | ICD-10-CM

## 2022-03-28 NOTE — Telephone Encounter (Signed)
Referral placed in Epic.

## 2022-03-28 NOTE — Progress Notes (Signed)
Molly Ortiz 08-03-1950 [id number]   History:    71 y.o. G7P0A7  Longstanding boyfriend.   RP:  Established patient presenting for annual gyn exam    HPI: Postmenopause, well on no hormone replacement therapy.  No postmenopausal bleeding.  No pelvic pain.  Rarely sexually active.  Boyfriend's conspirational theories affecting the relationship.  Pap Neg 08/2020.  Pap reflex today. Urine and bowel movements normal.  Breasts normal.  Mammo Neg 03/2022. Body mass index 23.07. Good fitness and healthy nutrition. BD 08/2018 Osteopenia, will repeat BD here now.  Health labs with family physician. Colono 03/2022.  C/O Left neck pain x a few months, will refer to Ortho.   Past medical history,surgical history, family history and social history were all reviewed and documented in the EPIC chart.  Gynecologic History No LMP recorded. Patient is postmenopausal.  Obstetric History OB History  Gravida Para Term Preterm AB Living  7       7 0  SAB IAB Ectopic Multiple Live Births  6 1          # Outcome Date GA Lbr Len/2nd Weight Sex Delivery Anes PTL Lv  7 IAB           6 SAB           5 SAB           4 SAB           3 SAB           2 SAB           1 SAB              ROS: A ROS was performed and pertinent positives and negatives are included in the history.  GENERAL: No fevers or chills. HEENT: No change in vision, no earache, sore throat or sinus congestion. NECK: No pain or stiffness. CARDIOVASCULAR: No chest pain or pressure. No palpitations. PULMONARY: No shortness of breath, cough or wheeze. GASTROINTESTINAL: No abdominal pain, nausea, vomiting or diarrhea, melena or bright red blood per rectum. GENITOURINARY: No urinary frequency, urgency, hesitancy or dysuria. MUSCULOSKELETAL: No joint or muscle pain, no back pain, no recent trauma. DERMATOLOGIC: No rash, no itching, no lesions. ENDOCRINE: No polyuria, polydipsia, no heat or cold intolerance. No recent change in weight. HEMATOLOGICAL: No  anemia or easy bruising or bleeding. NEUROLOGIC: No headache, seizures, numbness, tingling or weakness. PSYCHIATRIC: No depression, no loss of interest in normal activity or change in sleep pattern.     Exam:   BP 114/70   Pulse 81   Ht 5' 6.25" (1.683 m)   Wt 144 lb (65.3 kg)   SpO2 97%   BMI 23.07 kg/m   Body mass index is 23.07 kg/m.  General appearance : Well developed well nourished female. No acute distress HEENT: Eyes: no retinal hemorrhage or exudates,  Neck supple, trachea midline, no carotid bruits, no thyroidmegaly Lungs: Clear to auscultation, no rhonchi or wheezes, or rib retractions  Heart: Regular rate and rhythm, no murmurs or gallops Breast:Examined in sitting and supine position were symmetrical in appearance, no palpable masses or tenderness,  no skin retraction, no nipple inversion, no nipple discharge, no skin discoloration, no axillary or supraclavicular lymphadenopathy Abdomen: no palpable masses or tenderness, no rebound or guarding Extremities: no edema or skin discoloration or tenderness  Pelvic: Vulva: Normal             Vagina: No gross lesions or discharge  Cervix: No gross  lesions or discharge.  Pap reflex done.  Uterus  AV, normal size, shape and consistency, non-tender and mobile  Adnexa  Without masses or tenderness  Anus: Normal   Assessment/Plan:  71 y.o. female for annual exam   1. Encounter for routine gynecological examination with Papanicolaou smear of cervix Postmenopause, well on no hormone replacement therapy.  No postmenopausal bleeding.  No pelvic pain.  Rarely sexually active.  Boyfriend's conspirational theories affecting the relationship.  Pap Neg 08/2020.  Pap reflex today. Urine and bowel movements normal.  Breasts normal.  Mammo Neg 03/2022. Body mass index 23.07. Good fitness and healthy nutrition. BD 08/2018 Osteopenia, will repeat BD here now.  Health labs with family physician. Colono 03/2022.  C/O Left neck pain x a few months,  will refer to Ortho. - Cytology - PAP( [place])  2. Postmenopause Postmenopause, well on no hormone replacement therapy.  No postmenopausal bleeding.  No pelvic pain.  Rarely sexually active.   3. Osteopenia of multiple sites Body mass index 23.07. Good fitness and healthy nutrition. BD 08/2018 Osteopenia, will repeat BD here now.  Ca++ 1.5 g/d total, Vit D, Vit K2. - DG Bone Density; Future  4. Neck pain Refer to Ortho for investigation.  5. Other specified personal risk factors, not elsewhere classified  Other orders - buPROPion (WELLBUTRIN XL) 150 MG 24 hr tablet; Take 150 mg by mouth every morning. - ofloxacin (OCUFLOX) 0.3 % ophthalmic solution   Princess Bruins MD, 9:54 AM 03/28/2022

## 2022-03-28 NOTE — Telephone Encounter (Signed)
-----   Message from Princess Bruins, MD sent at 03/28/2022 10:21 AM EDT ----- Regarding: Refer to Orthopedist C/O Left neck pain x a few months, will refer to Ortho.

## 2022-04-02 LAB — CYTOLOGY - PAP: Diagnosis: NEGATIVE

## 2022-04-03 NOTE — Telephone Encounter (Signed)
Received message from referral coordinator at [facility] that they have made 3 unsuccessful attempts to schedule patient and they are closing the referral.  I called patient and left her a message about this and told her if she still wanted to schedule the appt she can call them at [phone] ext.[phone].

## 2022-05-01 ENCOUNTER — Ambulatory Visit: Payer: Medicare Other | Admitting: Podiatry

## 2022-05-01 DIAGNOSIS — S9032XA Contusion of left foot, initial encounter: Secondary | ICD-10-CM

## 2022-05-15 ENCOUNTER — Encounter: Payer: Self-pay | Admitting: Podiatry

## 2022-05-15 ENCOUNTER — Ambulatory Visit (INDEPENDENT_AMBULATORY_CARE_PROVIDER_SITE_OTHER): Payer: Medicare Other | Admitting: Podiatry

## 2022-05-15 ENCOUNTER — Ambulatory Visit (INDEPENDENT_AMBULATORY_CARE_PROVIDER_SITE_OTHER): Payer: Medicare Other

## 2022-05-15 VITALS — BP 134/73 | HR 74

## 2022-05-15 DIAGNOSIS — S9032XA Contusion of left foot, initial encounter: Secondary | ICD-10-CM | POA: Diagnosis not present

## 2022-05-15 DIAGNOSIS — M778 Other enthesopathies, not elsewhere classified: Secondary | ICD-10-CM | POA: Diagnosis not present

## 2022-05-15 NOTE — Progress Notes (Signed)
She presents today chief complaint of pain to the dorsal aspect of her left foot states has been aching for about 6 weeks denies any trauma.  She states that this feels like [place] when walking.  Objective: Vital signs stable alert oriented x 3 there is no erythema edema cellulitis drainage or odor.  Pulses are strong and palpable.  She does have pain on frontal plane range of motion at the tarsometatarsal joints particularly second.  She has a prominent navicular tuberosity.  Radiographs taken today demonstrate osteoarthritis at the first and second tarsometatarsal joint with a centrally located cyst within the navicular bone.  Otherwise there are no significant osseous abnormalities.  Assessment: Osteoarthritis dorsal aspect left foot.  Plan: Offered her an injection she declined discussed topical anti-inflammatories oral anti-inflammatories discussed appropriate shoe gear stretching ice therapy and shoe gear modifications.  Will follow-up with her on an as-needed basis.

## 2022-05-29 ENCOUNTER — Other Ambulatory Visit: Payer: Self-pay | Admitting: Obstetrics & Gynecology

## 2022-05-29 ENCOUNTER — Encounter (INDEPENDENT_AMBULATORY_CARE_PROVIDER_SITE_OTHER): Payer: Medicare Other

## 2022-05-29 DIAGNOSIS — M8589 Other specified disorders of bone density and structure, multiple sites: Secondary | ICD-10-CM

## 2022-05-29 DIAGNOSIS — Z13820 Encounter for screening for osteoporosis: Secondary | ICD-10-CM

## 2022-05-29 DIAGNOSIS — Z780 Asymptomatic menopausal state: Secondary | ICD-10-CM

## 2022-05-29 DIAGNOSIS — Z01419 Encounter for gynecological examination (general) (routine) without abnormal findings: Secondary | ICD-10-CM

## 2022-05-29 DIAGNOSIS — M542 Cervicalgia: Secondary | ICD-10-CM

## 2022-05-29 DIAGNOSIS — Z9189 Other specified personal risk factors, not elsewhere classified: Secondary | ICD-10-CM

## 2023-02-15 ENCOUNTER — Other Ambulatory Visit: Payer: Self-pay | Admitting: Family Medicine

## 2023-02-15 DIAGNOSIS — Z1231 Encounter for screening mammogram for malignant neoplasm of breast: Secondary | ICD-10-CM

## 2023-03-19 ENCOUNTER — Ambulatory Visit
Admission: RE | Admit: 2023-03-19 | Discharge: 2023-03-19 | Disposition: A | Discharge status: Home / Self Care | Payer: Medicare Other | Source: Ambulatory Visit | Attending: Family Medicine | Admitting: Family Medicine

## 2023-03-19 DIAGNOSIS — Z1231 Encounter for screening mammogram for malignant neoplasm of breast: Secondary | ICD-10-CM

## 2023-04-30 ENCOUNTER — Encounter: Payer: Self-pay | Admitting: Obstetrics and Gynecology

## 2023-04-30 ENCOUNTER — Ambulatory Visit (INDEPENDENT_AMBULATORY_CARE_PROVIDER_SITE_OTHER): Payer: Medicare Other | Admitting: Obstetrics and Gynecology

## 2023-04-30 ENCOUNTER — Other Ambulatory Visit (HOSPITAL_COMMUNITY)
Admission: RE | Admit: 2023-04-30 | Discharge: 2023-04-30 | Disposition: A | Discharge status: Home / Self Care | Payer: Medicare Other | Source: Ambulatory Visit | Attending: Obstetrics and Gynecology | Admitting: Obstetrics and Gynecology

## 2023-04-30 VITALS — BP 122/76 | HR 69 | Ht 66.5 in | Wt 147.0 lb

## 2023-04-30 DIAGNOSIS — Z124 Encounter for screening for malignant neoplasm of cervix: Secondary | ICD-10-CM | POA: Diagnosis present

## 2023-04-30 DIAGNOSIS — M8589 Other specified disorders of bone density and structure, multiple sites: Secondary | ICD-10-CM | POA: Insufficient documentation

## 2023-04-30 DIAGNOSIS — Z9189 Other specified personal risk factors, not elsewhere classified: Secondary | ICD-10-CM | POA: Diagnosis not present

## 2023-04-30 DIAGNOSIS — Z01419 Encounter for gynecological examination (general) (routine) without abnormal findings: Secondary | ICD-10-CM | POA: Insufficient documentation

## 2023-04-30 NOTE — Assessment & Plan Note (Signed)
Continue vitamin D+Calcium DXA due 2025

## 2023-04-30 NOTE — Assessment & Plan Note (Signed)
Cervical cancer screening performed according to ASCCP guidelines. Encouraged annual mammogram screening Colonoscopy UTD DXA UTD, repeat next year Labs and immunizations with her primary Encouraged safe sexual practices as indicated Encouraged healthy lifestyle practices with diet and exercise For patients over 74yo, I recommend 1200mg  calcium daily and 800IU of vitamin D daily.

## 2023-04-30 NOTE — Progress Notes (Signed)
72 y.o. G7P0070 postmenopausal female here for annual exam. Longstanding boyfriend.  Pt is requesting a PAP today due to family history of cervical/uterine cancer of her mother.  Pt is interested in finding something to increase her libido. Has pleasurable intercourse twice a week, however partner interested in more regular intercourse. Patient does enjoy masturbating.  Postmenopausal bleeding: none Pelvic discharge or pain: none Breast mass, nipple discharge or skin changes : none Last PAP:     Component Value Date/Time   DIAGPAP  03/28/2022 1016    - Negative for intraepithelial lesion or malignancy (NILM)   ADEQPAP  03/28/2022 1016    Satisfactory for evaluation; transformation zone component PRESENT.   Last mammogram: 03/21/2023 BIRADS 1, density c Last colonoscopy: per pt, a year or two ago  Last DXA: 05/29/22  Sexually active: yes  Exercising: Yes. Walking dog one mile a day  Smoker: no   GYN HISTORY: No significant history  OB History  Gravida Para Term Preterm AB Living  7       7 0  SAB IAB Ectopic Multiple Live Births  6 1          # Outcome Date GA Lbr Len/2nd Weight Sex Type Anes PTL Lv  7 IAB           6 SAB           5 SAB           4 SAB           3 SAB           2 SAB           1 SAB             Past Medical History:  Diagnosis Date   Adenomatous polyps    2 dap's 07/2011 [place]ai, [place]), w/ mediocre prep -per notes from [facility]   Arthritis    Degeneration of cervical intervertebral disc    Depression    Diverticulitis    Insomnia    Osteopenia    Plantar fasciitis    Vitamin D deficiency     Past Surgical History:  Procedure Laterality Date   COLONOSCOPY     DILATION AND CURETTAGE OF UTERUS     MISSED AB   LUMBAR FUSION  11/2016   LUMBAR FUSION  02/2015    Current Outpatient Medications on File Prior to Visit  Medication Sig Dispense Refill   ALPRAZolam (XANAX) 0.5 MG tablet Take 0.5 mg by mouth at bedtime as needed  for anxiety.     aspirin EC 81 MG tablet Take 1 tablet (81 mg total) by mouth daily. Swallow whole. 30 tablet 11   Cholecalciferol (VITAMIN D) 50 MCG (2000 UT) CAPS 1 capsule     EPINEPHrine 0.3 mg/0.3 mL IJ SOAJ injection INJECT AS DIRECTED FOR LIFE THREATENING REACTION     Multiple Vitamin (MULTIVITAMIN PO) Take by mouth.     No current facility-administered medications on file prior to visit.    Social History   Socioeconomic History   Marital status: Single    Spouse name: Not on file   Number of children: Not on file   Years of education: Not on file   Highest education level: Bachelor's degree (e.g., BA, AB, BS)  Occupational History   Not on file  Tobacco Use   Smoking status: Never   Smokeless tobacco: Never  Vaping Use   Vaping status: Never Used  Substance and  Sexual Activity   Alcohol use: Yes    Alcohol/week: 7.0 standard drinks of alcohol    Types: 2 Glasses of wine, 5 Shots of liquor per week    Comment: 10 a week   Drug use: Never   Sexual activity: Yes    Partners: Male    Birth control/protection: Post-menopausal    Comment: first sexual encounter at age 43 yrs old . only 5 partners in a lifetime  Other Topics Concern   Not on file  Social History Narrative   Caffeine: coffee 2 cups per day   Right handed   Lives alone   Social Determinants of Health   Financial Resource Strain: Not on file  Food Insecurity: Not on file  Transportation Needs: Not on file  Physical Activity: Not on file  Stress: Not on file  Social Connections: Not on file  Intimate Partner Violence: Not on file    Family History  Problem Relation Age of Onset   Cancer Mother        CERVICAL OR UTERINE   Diabetes Father    Hypertension Father    Cancer Father        LUNG   Migraines Niece     No Known Allergies    PE Today's Vitals   04/30/23 1522  BP: 122/76  Pulse: 69  SpO2: 100%  Weight: 147 lb (66.7 kg)  Height: 5' 6.5" (1.689 m)   Body mass index is 23.37  kg/m.  Physical Exam Vitals reviewed. Exam conducted with a chaperone present.  Constitutional:      General: She is not in acute distress.    Appearance: Normal appearance.  HENT:     Head: Normocephalic and atraumatic.     Nose: Nose normal.  Eyes:     Extraocular Movements: Extraocular movements intact.     Conjunctiva/sclera: Conjunctivae normal.  Neck:     Thyroid: No thyroid mass, thyromegaly or thyroid tenderness.  Pulmonary:     Effort: Pulmonary effort is normal.  Chest:     Chest wall: No mass or tenderness.  Breasts:    Right: Normal. No swelling, mass, nipple discharge, skin change or tenderness.     Left: Normal. No swelling, mass, nipple discharge, skin change or tenderness.  Abdominal:     General: There is no distension.     Palpations: Abdomen is soft.     Tenderness: There is no abdominal tenderness.  Genitourinary:    General: Normal vulva.     Exam position: Lithotomy position.     Urethra: No prolapse.     Vagina: Normal. No vaginal discharge or bleeding.     Cervix: Normal. No lesion.     Uterus: Normal. Not enlarged and not tender.      Adnexa: Right adnexa normal and left adnexa normal.  Musculoskeletal:        General: Normal range of motion.     Cervical back: Normal range of motion.  Lymphadenopathy:     Upper Body:     Right upper body: No axillary adenopathy.     Left upper body: No axillary adenopathy.     Lower Body: No right inguinal adenopathy. No left inguinal adenopathy.  Skin:    General: Skin is warm and dry.  Neurological:     General: No focal deficit present.     Mental Status: She is alert.  Psychiatric:        Mood and Affect: Mood normal.  Behavior: Behavior normal.       Assessment and Plan:        Well woman exam with routine gynecological exam Assessment & Plan: Cervical cancer screening performed according to ASCCP guidelines. Encouraged annual mammogram screening Colonoscopy UTD DXA UTD, repeat next  year Labs and immunizations with her primary Encouraged safe sexual practices as indicated Encouraged healthy lifestyle practices with diet and exercise For patients over 70yo, I recommend 1200mg  calcium daily and 800IU of vitamin D daily.    Cervical cancer screening -     Cytology - PAP  Other specified personal risk factors, not elsewhere classified  Osteopenia of multiple sites Assessment & Plan: Continue vitamin D+Calcium DXA due 2025    Reviewed PAP is generally covered q37yr by medicare, however patient desires annual screening. Patient aware of need to potentially cover PAP if not covered by insurance and is okay with that.  Rosalyn Gess, MD

## 2023-04-30 NOTE — Patient Instructions (Signed)
For patients under 50-74yo, I recommend 1200mg  calcium daily and 600IU of vitamin D daily. For patients over 74yo, I recommend 1200mg  calcium daily and 800IU of vitamin D daily.  Health Maintenance, Female Adopting a healthy lifestyle and getting preventive care are important in promoting health and wellness. Ask your health care provider about: The right schedule for you to have regular tests and exams. Things you can do on your own to prevent diseases and keep yourself healthy. What should I know about diet, weight, and exercise? Eat a healthy diet  Eat a diet that includes plenty of vegetables, fruits, low-fat dairy products, and lean protein. Do not eat a lot of foods that are high in solid fats, added sugars, or sodium. Maintain a healthy weight Body mass index (BMI) is used to identify weight problems. It estimates body fat based on height and weight. Your health care provider can help determine your BMI and help you achieve or maintain a healthy weight. Get regular exercise Get regular exercise. This is one of the most important things you can do for your health. Most adults should: Exercise for at least 150 minutes each week. The exercise should increase your heart rate and make you sweat (moderate-intensity exercise). Do strengthening exercises at least twice a week. This is in addition to the moderate-intensity exercise. Spend less time sitting. Even light physical activity can be beneficial. Watch cholesterol and blood lipids Have your blood tested for lipids and cholesterol at 74 years of age, then have this test every 5 years. Have your cholesterol levels checked more often if: Your lipid or cholesterol levels are high. You are older than 73 years of age. You are at high risk for heart disease. What should I know about cancer screening? Depending on your health history and family history, you may need to have cancer screening at various ages. This may include screening  for: Breast cancer. Cervical cancer. Colorectal cancer. Skin cancer. Lung cancer. What should I know about heart disease, diabetes, and high blood pressure? Blood pressure and heart disease High blood pressure causes heart disease and increases the risk of stroke. This is more likely to develop in people who have high blood pressure readings or are overweight. Have your blood pressure checked: Every 3-5 years if you are 83-74 years of age. Every year if you are 4 years old or older. Diabetes Have regular diabetes screenings. This checks your fasting blood sugar level. Have the screening done: Once every three years after age 68 if you are at a normal weight and have a low risk for diabetes. More often and at a younger age if you are overweight or have a high risk for diabetes. What should I know about preventing infection? Hepatitis B If you have a higher risk for hepatitis B, you should be screened for this virus. Talk with your health care provider to find out if you are at risk for hepatitis B infection. Hepatitis C Testing is recommended for: Everyone born from [birth year] through 1967. Anyone with known risk factors for hepatitis C. Sexually transmitted infections (STIs) Get screened for STIs, including gonorrhea and chlamydia, if: You are sexually active and are younger than 74 years of age. You are older than 74 years of age and your health care provider tells you that you are at risk for this type of infection. Your sexual activity has changed since you were last screened, and you are at increased risk for chlamydia or gonorrhea. Ask your health care provider if  you are at risk. Ask your health care provider about whether you are at high risk for HIV. Your health care provider may recommend a prescription medicine to help prevent HIV infection. If you choose to take medicine to prevent HIV, you should first get tested for HIV. You should then be tested every 3 months for as long as you  are taking the medicine. Osteoporosis and menopause Osteoporosis is a disease in which the bones lose minerals and strength with aging. This can result in bone fractures. If you are 44 years old or older, or if you are at risk for osteoporosis and fractures, ask your health care provider if you should: Be screened for bone loss. Take a calcium or vitamin D supplement to lower your risk of fractures. Be given hormone replacement therapy (HRT) to treat symptoms of menopause. Follow these instructions at home: Alcohol use Do not drink alcohol if: Your health care provider tells you not to drink. You are pregnant, may be pregnant, or are planning to become pregnant. If you drink alcohol: Limit how much you have to: 0-1 drink a day. Know how much alcohol is in your drink. In the [place] one drink equals one 12 oz bottle of beer (355 mL), one 5 oz glass of wine (148 mL), or one 1 oz glass of hard liquor (44 mL). Lifestyle Do not use any products that contain nicotine or tobacco. These products include cigarettes, chewing tobacco, and vaping devices, such as e-cigarettes. If you need help quitting, ask your health care provider. Do not use street drugs. Do not share needles. Ask your health care provider for help if you need support or information about quitting drugs. General instructions Schedule regular health, dental, and eye exams. Stay current with your vaccines. Tell your health care provider if: You often feel depressed. You have ever been abused or do not feel safe at home. Summary Adopting a healthy lifestyle and getting preventive care are important in promoting health and wellness. Follow your health care provider's instructions about healthy diet, exercising, and getting tested or screened for diseases. Follow your health care provider's instructions on monitoring your cholesterol and blood pressure. This information is not intended to replace advice given to you by your health  care provider. Make sure you discuss any questions you have with your health care provider. Document Revised: 10/17/2020 Document Reviewed: 10/17/2020 Elsevier Patient Education  2024 [employer].

## 2023-05-03 LAB — CYTOLOGY - PAP: Diagnosis: NEGATIVE

## 2024-04-14 ENCOUNTER — Other Ambulatory Visit: Payer: Self-pay | Admitting: Obstetrics and Gynecology

## 2024-04-14 DIAGNOSIS — Z1231 Encounter for screening mammogram for malignant neoplasm of breast: Secondary | ICD-10-CM

## 2024-04-22 ENCOUNTER — Ambulatory Visit
Admission: RE | Admit: 2024-04-22 | Discharge: 2024-04-22 | Disposition: A | Discharge status: Home / Self Care | Source: Ambulatory Visit | Attending: Obstetrics and Gynecology | Admitting: Obstetrics and Gynecology

## 2024-04-22 DIAGNOSIS — Z1231 Encounter for screening mammogram for malignant neoplasm of breast: Secondary | ICD-10-CM

## 2024-04-25 ENCOUNTER — Ambulatory Visit: Payer: Self-pay | Admitting: Obstetrics and Gynecology

## 2024-04-30 ENCOUNTER — Other Ambulatory Visit (HOSPITAL_BASED_OUTPATIENT_CLINIC_OR_DEPARTMENT_OTHER): Payer: Self-pay

## 2024-04-30 MED ORDER — COMIRNATY 30 MCG/0.3ML IM SUSY
0.3000 mL | PREFILLED_SYRINGE | Freq: Once | INTRAMUSCULAR | 0 refills | Status: AC
  Filled 2024-04-30: qty 0.3, 1d supply, fill #0

## 2024-05-28 ENCOUNTER — Ambulatory Visit: Admitting: Obstetrics and Gynecology

## 2024-05-28 ENCOUNTER — Encounter: Payer: Self-pay | Admitting: Obstetrics and Gynecology

## 2024-05-28 ENCOUNTER — Other Ambulatory Visit (HOSPITAL_COMMUNITY)
Admission: RE | Admit: 2024-05-28 | Discharge: 2024-05-28 | Disposition: A | Discharge status: Home / Self Care | Source: Ambulatory Visit | Attending: Obstetrics and Gynecology | Admitting: Obstetrics and Gynecology

## 2024-05-28 VITALS — BP 132/78 | HR 68 | Resp 14 | Ht 66.0 in | Wt 145.0 lb

## 2024-05-28 DIAGNOSIS — E2839 Other primary ovarian failure: Secondary | ICD-10-CM

## 2024-05-28 DIAGNOSIS — Z01419 Encounter for gynecological examination (general) (routine) without abnormal findings: Secondary | ICD-10-CM | POA: Insufficient documentation

## 2024-05-28 DIAGNOSIS — Z9189 Other specified personal risk factors, not elsewhere classified: Secondary | ICD-10-CM

## 2024-05-28 DIAGNOSIS — N952 Postmenopausal atrophic vaginitis: Secondary | ICD-10-CM | POA: Diagnosis not present

## 2024-05-28 MED ORDER — INTRAROSA 6.5 MG VA INST: 1.0000 | VAGINAL_INSERT | Freq: Every evening | VAGINAL | 12 refills | Status: AC | PRN

## 2024-05-28 NOTE — Progress Notes (Signed)
 73 y.o. y.o. female here for annual medicare exam. No LMP recorded. Patient is postmenopausal.    Pt is requesting a PAP today due to family history of cervical/uterine cancer of her mother.   Reports decreased libido and vaginal dryness. Partner still desires intercourse and is important in their relationship. Postmenopausal bleeding: none Pelvic discharge or pain: none Breast mass, nipple discharge or skin changes : none Last PAP:     Component Value Date/Time   DIAGPAP  03/28/2022 1016    - Negative for intraepithelial lesion or malignancy (NILM)   ADEQPAP  03/28/2022 1016    Satisfactory for evaluation; transformation zone component PRESENT.   Last mammogram: 04/25/24 Last colonoscopy: per pt, a year or two ago  Last DXA: 05/29/22 -2.0 normal frax, repeat ordered Sexually active: yes  Exercising: Yes. Walking dog one mile a day  Smoker: no   Body mass index is 23.4 kg/m.     Blood pressure 132/78, pulse 68, resp. rate 14, height 5' 6 (1.676 m), weight 145 lb (65.8 kg).     Component Value Date/Time   DIAGPAP  04/30/2023 1627    - Negative for intraepithelial lesion or malignancy (NILM)   DIAGPAP  03/28/2022 1016    - Negative for intraepithelial lesion or malignancy (NILM)   ADEQPAP  04/30/2023 1627    Satisfactory for evaluation; transformation zone component PRESENT.   ADEQPAP  03/28/2022 1016    Satisfactory for evaluation; transformation zone component PRESENT.    GYN HISTORY:    Component Value Date/Time   DIAGPAP  04/30/2023 1627    - Negative for intraepithelial lesion or malignancy (NILM)   DIAGPAP  03/28/2022 1016    - Negative for intraepithelial lesion or malignancy (NILM)   ADEQPAP  04/30/2023 1627    Satisfactory for evaluation; transformation zone component PRESENT.   ADEQPAP  03/28/2022 1016    Satisfactory for evaluation; transformation zone component PRESENT.    OB History  Gravida Para Term Preterm AB Living  7    7 0  SAB IAB  Ectopic Multiple Live Births  6 1       # Outcome Date GA Lbr Len/2nd Weight Sex Type Anes PTL Lv  7 IAB           6 SAB           5 SAB           4 SAB           3 SAB           2 SAB           1 SAB             Past Medical History:  Diagnosis Date   Adenomatous polyps    2 dap's 07/2011 [place], [place]), w/ mediocre prep -per [facility] from [facility]   Arthritis    Degeneration of cervical intervertebral disc    Depression    Diverticulitis    Insomnia    Osteopenia    Plantar fasciitis    Vitamin D  deficiency     Past Surgical History:  Procedure Laterality Date   COLONOSCOPY     DILATION AND CURETTAGE OF UTERUS     MISSED AB   LUMBAR FUSION  11/2016   LUMBAR FUSION  02/2015    Medications Ordered Prior to Encounter[1]  Social History   Socioeconomic History   Marital status: Single    Spouse name: Not on file  Number of children: Not on file   Years of education: Not on file   Highest education level: Bachelor's degree (e.g., BA, AB, BS)  Occupational History   Not on file  Tobacco Use   Smoking status: Never   Smokeless tobacco: Never  Vaping Use   Vaping status: Never Used  Substance and Sexual Activity   Alcohol use: Yes    Alcohol/week: 7.0 standard drinks of alcohol    Types: 2 Glasses of wine, 5 Shots of liquor per week    Comment: 10 a week   Drug use: Never   Sexual activity: Yes    Partners: Male    Birth control/protection: Post-menopausal    Comment: first sexual encounter at age 66 yrs old . only 5 partners in a lifetime  Other Topics Concern   Not on file  Social History Narrative   Caffeine: coffee 2 cups per day   Right handed   Lives alone   Social Drivers of Health   Tobacco Use: Low Risk (05/28/2024)   Patient History    Smoking Tobacco Use: Never    Smokeless Tobacco Use: Never    Passive Exposure: Not on file  Financial Resource Strain: Not on file  Food Insecurity: Not on file  Transportation Needs: Not on  file  Physical Activity: Not on file  Stress: Not on file  Social Connections: Not on file  Intimate Partner Violence: Not on file  Depression (PHQ2-9): Low Risk (05/28/2024)   Depression (PHQ2-9)    PHQ-2 Score: 0  Alcohol Screen: Not on file  Housing: Not on file  Utilities: Not on file  Health Literacy: Not on file    Family History  Problem Relation Age of Onset   Cancer Mother        CERVICAL OR UTERINE   Diabetes Father    Hypertension Father    Cancer Father        LUNG   Migraines Niece      Allergies[2]    Patient's last menstrual period was No LMP recorded. Patient is postmenopausal..            Review of Systems Alls systems reviewed and are negative.     Physical Exam Constitutional:      Appearance: Normal appearance.  Genitourinary:     Vulva and urethral meatus normal.     No lesions in the vagina.     Right Labia: No rash, lesions or skin changes.    Left Labia: No lesions, skin changes or rash.    No vaginal discharge or tenderness.     No vaginal prolapse present.    Moderate vaginal atrophy present.     Right Adnexa: not tender, not palpable and no mass present.    Left Adnexa: not tender, not palpable and no mass present.    No cervical motion tenderness or discharge.     Uterus is not enlarged, tender or irregular.  Breasts:    Right: Normal.     Left: Normal.  HENT:     Head: Normocephalic.  Neck:     Thyroid: No thyroid mass, thyromegaly or thyroid tenderness.  Cardiovascular:     Rate and Rhythm: Normal rate and regular rhythm.     Heart sounds: Normal heart sounds, S1 normal and S2 normal.  Pulmonary:     Effort: Pulmonary effort is normal.     Breath sounds: Normal breath sounds and air entry.  Abdominal:     General: There is no  distension.     Palpations: Abdomen is soft. There is no mass.     Tenderness: There is no abdominal tenderness. There is no guarding or rebound.  Musculoskeletal:        General: Normal range  of motion.     Cervical back: Full passive range of motion without pain, normal range of motion and neck supple. No tenderness.     Right lower leg: No edema.     Left lower leg: No edema.  Neurological:     Mental Status: She is alert.  Skin:    General: Skin is warm.  Psychiatric:        Mood and Affect: Mood normal.        Behavior: Behavior normal.        Thought Content: Thought content normal.  Vitals and nursing note reviewed. Exam conducted with a chaperone present.       A:         Well Woman GYN exam                              P:        Pap smear collected today Patient desires collection with mother's history Encouraged annual mammogram screening Colon cancer screening up-to-date DXA ordered today Labs and immunizations to do with PMD Discussed breast self exams Encouraged healthy lifestyle practices Encouraged Vit D and Calcium  Intrarosa  for dyspareunia low libido and vaginal dryness was sent in. Discussed insurance may not cover  No follow-ups on file.  Almarie MARLA Carpen      [1]  Current Outpatient Medications on File Prior to Visit  Medication Sig Dispense Refill   ALPRAZolam (XANAX) 0.5 MG tablet Take 0.5 mg by mouth at bedtime as needed for anxiety.     aspirin  EC 81 MG tablet Take 1 tablet (81 mg total) by mouth daily. Swallow whole. 30 tablet 11   buPROPion (WELLBUTRIN XL) 150 MG 24 hr tablet 1 Tablet every morning Orally Once a day     Cholecalciferol (VITAMIN D ) 50 MCG (2000 UT) CAPS 1 capsule     EPINEPHrine 0.3 mg/0.3 mL IJ SOAJ injection INJECT AS DIRECTED FOR LIFE THREATENING REACTION     Multiple Vitamin (MULTIVITAMIN PO) Take by mouth.     ofloxacin (OCUFLOX) 0.3 % ophthalmic solution      No current facility-administered medications on file prior to visit.  [2] No Known Allergies

## 2024-05-28 NOTE — Addendum Note (Signed)
 Addended by: GLENNON ALMARIE POUR on: 05/28/2024 12:06 PM   Modules accepted: Orders

## 2024-06-01 ENCOUNTER — Ambulatory Visit: Payer: Self-pay | Admitting: Obstetrics and Gynecology

## 2024-06-01 ENCOUNTER — Telehealth: Payer: Self-pay

## 2024-06-01 LAB — CYTOLOGY - PAP: Diagnosis: NEGATIVE

## 2024-06-01 NOTE — Telephone Encounter (Signed)
 Patient is aware a prior authorization was submitted for Intrarosa  6.5mg  insert She is aware it may take several days for a response and the office will be closed for the holiday.  Key: [id number]

## 2024-06-02 NOTE — Telephone Encounter (Signed)
 Patient aware that PA was approved.

## 2024-06-23 ENCOUNTER — Ambulatory Visit (HOSPITAL_BASED_OUTPATIENT_CLINIC_OR_DEPARTMENT_OTHER)
Admission: RE | Admit: 2024-06-23 | Discharge: 2024-06-23 | Disposition: A | Discharge status: Home / Self Care | Payer: Self-pay | Source: Ambulatory Visit | Attending: Obstetrics and Gynecology | Admitting: Obstetrics and Gynecology

## 2024-06-23 DIAGNOSIS — Z9189 Other specified personal risk factors, not elsewhere classified: Secondary | ICD-10-CM | POA: Insufficient documentation

## 2024-07-16 ENCOUNTER — Encounter: Payer: Self-pay | Admitting: Obstetrics and Gynecology
